# Patient Record
Sex: Male | Born: 1980
Health system: Southern US, Community
[De-identification: ages and names within clinical notes are randomized; demographics above are authoritative.]

## PROBLEM LIST (undated history)

## (undated) DIAGNOSIS — G809 Cerebral palsy, unspecified: Secondary | ICD-10-CM

## (undated) DIAGNOSIS — L723 Sebaceous cyst: Secondary | ICD-10-CM

## (undated) HISTORY — PX: COLOSTOMY: SHX63

## (undated) HISTORY — DX: Cerebral palsy, unspecified: G80.9

## (undated) HISTORY — PX: COLOSTOMY REVERSAL: SHX5782

## (undated) HISTORY — DX: Sebaceous cyst: L72.3

---

## 2004-10-30 ENCOUNTER — Ambulatory Visit: Payer: Self-pay | Admitting: Family Medicine

## 2004-11-26 ENCOUNTER — Ambulatory Visit: Payer: Self-pay | Admitting: Family Medicine

## 2005-02-12 ENCOUNTER — Ambulatory Visit: Payer: Self-pay | Admitting: Family Medicine

## 2006-01-20 ENCOUNTER — Ambulatory Visit: Payer: Self-pay | Admitting: Family Medicine

## 2006-01-20 ENCOUNTER — Ambulatory Visit (HOSPITAL_COMMUNITY): Admission: RE | Admit: 2006-01-20 | Discharge: 2006-01-20 | Payer: Self-pay | Admitting: Family Medicine

## 2006-07-22 ENCOUNTER — Ambulatory Visit: Payer: Self-pay | Admitting: Family Medicine

## 2006-08-05 ENCOUNTER — Ambulatory Visit: Payer: Self-pay | Admitting: Family Medicine

## 2006-09-09 DIAGNOSIS — H539 Unspecified visual disturbance: Secondary | ICD-10-CM | POA: Insufficient documentation

## 2006-09-09 DIAGNOSIS — G809 Cerebral palsy, unspecified: Secondary | ICD-10-CM | POA: Insufficient documentation

## 2006-09-09 DIAGNOSIS — I10 Essential (primary) hypertension: Secondary | ICD-10-CM | POA: Insufficient documentation

## 2007-05-10 ENCOUNTER — Ambulatory Visit: Payer: Self-pay | Admitting: Family Medicine

## 2007-10-04 ENCOUNTER — Ambulatory Visit: Payer: Self-pay | Admitting: Family Medicine

## 2007-10-04 ENCOUNTER — Encounter: Payer: Self-pay | Admitting: *Deleted

## 2007-10-04 ENCOUNTER — Encounter: Payer: Self-pay | Admitting: Family Medicine

## 2007-10-04 DIAGNOSIS — L723 Sebaceous cyst: Secondary | ICD-10-CM

## 2007-10-04 HISTORY — DX: Sebaceous cyst: L72.3

## 2007-10-04 LAB — CONVERTED CEMR LAB
ALT: 23 units/L (ref 0–53)
AST: 27 units/L (ref 0–37)
Albumin: 4.5 g/dL (ref 3.5–5.2)
Alkaline Phosphatase: 117 units/L (ref 39–117)
BUN: 10 mg/dL (ref 6–23)
Basophils Absolute: 0 10*3/uL (ref 0.0–0.1)
Basophils Relative: 0 % (ref 0–1)
CO2: 20 meq/L (ref 19–32)
Calcium: 9.5 mg/dL (ref 8.4–10.5)
Chloride: 102 meq/L (ref 96–112)
Creatinine, Ser: 0.91 mg/dL (ref 0.40–1.50)
Eosinophils Absolute: 0.1 10*3/uL (ref 0.0–0.7)
Eosinophils Relative: 2 % (ref 0–5)
Glucose, Bld: 72 mg/dL (ref 70–99)
HCT: 50 % (ref 39.0–52.0)
Hemoglobin: 16.5 g/dL (ref 13.0–17.0)
Lymphocytes Relative: 43 % (ref 12–46)
Lymphs Abs: 2.3 10*3/uL (ref 0.7–4.0)
MCHC: 33 g/dL (ref 30.0–36.0)
MCV: 87.6 fL (ref 78.0–100.0)
Monocytes Absolute: 0.6 10*3/uL (ref 0.1–1.0)
Monocytes Relative: 11 % (ref 3–12)
Neutro Abs: 2.3 10*3/uL (ref 1.7–7.7)
Neutrophils Relative %: 44 % (ref 43–77)
Platelets: 224 10*3/uL (ref 150–400)
Potassium: 5 meq/L (ref 3.5–5.3)
RBC: 5.71 M/uL (ref 4.22–5.81)
RDW: 14.5 % (ref 11.5–15.5)
Sodium: 141 meq/L (ref 135–145)
TSH: 1.431 microintl units/mL (ref 0.350–5.50)
Total Bilirubin: 0.4 mg/dL (ref 0.3–1.2)
Total Protein: 8.6 g/dL — ABNORMAL HIGH (ref 6.0–8.3)
WBC: 5.3 10*3/uL (ref 4.0–10.5)

## 2007-11-14 ENCOUNTER — Encounter (INDEPENDENT_AMBULATORY_CARE_PROVIDER_SITE_OTHER): Payer: Self-pay | Admitting: *Deleted

## 2007-11-25 ENCOUNTER — Ambulatory Visit: Payer: Self-pay | Admitting: Family Medicine

## 2007-12-06 ENCOUNTER — Telehealth: Payer: Self-pay | Admitting: *Deleted

## 2008-01-17 ENCOUNTER — Encounter (INDEPENDENT_AMBULATORY_CARE_PROVIDER_SITE_OTHER): Payer: Self-pay | Admitting: Family Medicine

## 2008-01-17 ENCOUNTER — Ambulatory Visit: Payer: Self-pay | Admitting: Family Medicine

## 2008-02-02 ENCOUNTER — Telehealth: Payer: Self-pay | Admitting: *Deleted

## 2008-02-17 ENCOUNTER — Encounter: Payer: Self-pay | Admitting: *Deleted

## 2008-03-08 ENCOUNTER — Ambulatory Visit: Payer: Self-pay | Admitting: Family Medicine

## 2008-03-08 ENCOUNTER — Encounter (INDEPENDENT_AMBULATORY_CARE_PROVIDER_SITE_OTHER): Payer: Self-pay | Admitting: Family Medicine

## 2008-03-08 LAB — CONVERTED CEMR LAB
BUN: 10 mg/dL (ref 6–23)
Bilirubin Urine: NEGATIVE
Blood in Urine, dipstick: NEGATIVE
CO2: 26 meq/L (ref 19–32)
Calcium: 9.7 mg/dL (ref 8.4–10.5)
Chloride: 101 meq/L (ref 96–112)
Cholesterol: 140 mg/dL (ref 0–200)
Creatinine, Ser: 0.8 mg/dL (ref 0.40–1.50)
Glucose, Bld: 71 mg/dL (ref 70–99)
Glucose, Urine, Semiquant: NEGATIVE
HDL: 44 mg/dL (ref >39)
Ketones, urine, test strip: NEGATIVE
LDL Cholesterol: 74 mg/dL (ref 0–99)
Nitrite: NEGATIVE
Potassium: 4.2 meq/L (ref 3.5–5.3)
Protein, U semiquant: NEGATIVE
Sodium: 142 meq/L (ref 135–145)
Specific Gravity, Urine: 1.015
Total CHOL/HDL Ratio: 3.2
Triglycerides: 109 mg/dL (ref <150)
Urobilinogen, UA: 0.2
VLDL: 22 mg/dL (ref 0–40)
WBC Urine, dipstick: NEGATIVE
pH: 7

## 2008-03-13 ENCOUNTER — Telehealth (INDEPENDENT_AMBULATORY_CARE_PROVIDER_SITE_OTHER): Payer: Self-pay | Admitting: Family Medicine

## 2008-04-24 ENCOUNTER — Ambulatory Visit: Payer: Self-pay | Admitting: Family Medicine

## 2008-06-06 ENCOUNTER — Encounter (INDEPENDENT_AMBULATORY_CARE_PROVIDER_SITE_OTHER): Payer: Self-pay | Admitting: *Deleted

## 2008-11-09 ENCOUNTER — Ambulatory Visit: Payer: Self-pay | Admitting: Family Medicine

## 2008-11-20 ENCOUNTER — Ambulatory Visit: Payer: Self-pay | Admitting: Family Medicine

## 2008-11-20 ENCOUNTER — Telehealth (INDEPENDENT_AMBULATORY_CARE_PROVIDER_SITE_OTHER): Payer: Self-pay | Admitting: Family Medicine

## 2008-12-17 ENCOUNTER — Telehealth: Payer: Self-pay | Admitting: *Deleted

## 2008-12-21 ENCOUNTER — Ambulatory Visit: Payer: Self-pay | Admitting: Family Medicine

## 2008-12-28 ENCOUNTER — Ambulatory Visit: Payer: Self-pay

## 2009-02-05 ENCOUNTER — Ambulatory Visit: Payer: Self-pay | Admitting: Family Medicine

## 2009-04-02 ENCOUNTER — Ambulatory Visit: Payer: Self-pay | Admitting: Family Medicine

## 2009-04-16 ENCOUNTER — Ambulatory Visit: Payer: Self-pay | Admitting: Family Medicine

## 2009-04-16 ENCOUNTER — Telehealth: Payer: Self-pay | Admitting: Sports Medicine

## 2009-05-03 ENCOUNTER — Ambulatory Visit: Payer: Self-pay | Admitting: Family Medicine

## 2010-05-16 ENCOUNTER — Telehealth (INDEPENDENT_AMBULATORY_CARE_PROVIDER_SITE_OTHER): Payer: Self-pay | Admitting: Family Medicine

## 2010-06-02 ENCOUNTER — Encounter: Payer: Self-pay | Admitting: Sports Medicine

## 2010-06-02 ENCOUNTER — Ambulatory Visit: Payer: Self-pay | Admitting: Family Medicine

## 2010-06-02 DIAGNOSIS — H1045 Other chronic allergic conjunctivitis: Secondary | ICD-10-CM

## 2010-06-02 LAB — CONVERTED CEMR LAB
BUN: 11 mg/dL (ref 6–23)
CO2: 26 meq/L (ref 19–32)
Calcium: 9.5 mg/dL (ref 8.4–10.5)
Chloride: 102 meq/L (ref 96–112)
Creatinine, Ser: 0.93 mg/dL (ref 0.40–1.50)
Glucose, Bld: 89 mg/dL (ref 70–99)
Potassium: 4.2 meq/L (ref 3.5–5.3)
Sodium: 138 meq/L (ref 135–145)

## 2010-06-23 ENCOUNTER — Ambulatory Visit: Payer: Self-pay

## 2010-07-08 ENCOUNTER — Ambulatory Visit: Payer: Self-pay | Admitting: Family Medicine

## 2010-08-14 NOTE — Assessment & Plan Note (Signed)
 Summary: bp ck & flu ,df  Nurse Visit   Vital Signs:  Patient profile:   30 year old male Temp:     98.8 degrees F Pulse rate:   80 / minute BP sitting:   132 / 82  (right arm)  Vitals Entered By: Avelina Sharps RN (May 03, 2009 2:20 PM)  Allergies: No Known Drug Allergies  Immunizations Administered:  Influenza Vaccine # 1:    Vaccine Type: Fluvax MCR    Site: left deltoid    Mfr: GlaxoSmithKline    Dose: 0.5 ml    Route: IM    Given by: Avelina Sharps RN    Exp. Date: 01/09/2010    Lot #: [id number]    VIS given: 02/19/2009  Flu Vaccine Consent Questions:    Do you have a history of severe allergic reactions to this vaccine? no    Any prior history of allergic reactions to egg and/or gelatin? no    Do you have a sensitivity to the preservative Thimersol? no    Do you have a past history of Guillan-Barre Syndrome? no    Do you currently have an acute febrile illness? no    Have you ever had a severe reaction to latex? no    Vaccine information given and explained to patient? yes  Orders Added: 1)  Influenza Vaccine MCR [00025] 2)  Administration Flu vaccine - MCR [G0008]     taking medications as directed. will forward BP reading today to MD. Avelina Sharps RN  May 03, 2009 2:22 PM  Appended Document: bp ck & flu ,df    Prescriptions: AMLODIPINE  BESYLATE 10 MG TABS (AMLODIPINE  BESYLATE) One tab by mouth daily.  #90 x 6   Entered and Authorized by:   Debby Petties MD   Signed by:   Debby Petties MD on 05/05/2009   Method used:   Electronically to        [facility] [address] (retail)       [address] [place], [place]  [address]       Ph: [phone] or [phone]       Fax: [phone]   RxID:   [id number]     Appended Document: bp ck & flu ,df mother  notified that rx has been sent in.

## 2010-08-14 NOTE — Assessment & Plan Note (Signed)
 Summary: bp check/eo  Nurse Visit   Vital Signs:  Patient profile:   30 year old male Pulse rate:   74 / minute BP sitting:   144 / 90  (right arm)  Vitals Entered By: Avelina Sharps RN (April 16, 2009 4:09 PM)  Allergies: No Known Drug Allergies  Orders Added: 1)  No Charge Patient Arrived (NCPA0) [NCPA0]   patient rested 10--15 minutes prior to BP check .mother is giving all medications as directed. will send message to Dr. Curtis  for review. Avelina Sharps RN  April 16, 2009 4:11 PM    Thanks, still elevated, will double Toprol  XL -Dr. ONEIDA.

## 2010-08-14 NOTE — Assessment & Plan Note (Signed)
 Summary: f/up,tcb   Vital Signs:  Patient profile:   30 year old male Weight:      185.7 pounds Temp:     98.7 degrees F oral Pulse rate:   69 / minute BP sitting:   152 / 100  (left arm)  Vitals Entered By: Letitia Reusing (February 05, 2009 8:47 AM)  Serial Vital Signs/Assessments:  Time      Position  BP       Pulse  Resp  Temp     By                     130/90                         Debby Petties MD  CC: f/u Is Patient Diabetic? No   Primary Care Provider:  Debby Petties MD  CC:  f/u.  History of Present Illness: 67M with MRCP comes in for follow up of metatarsalgia, HTN.  Metatarsalgia and hindfoot varus:  Dx mid June at the [facility].  Lateral heel wedge with metatarsal pad placed as he tends to oversupinate.  Was in multiple braces in the past as he used to toe-walk.  Does not appear to be in pain.  Mother concerned that he will need braces again and become clubfoot.  Was concerned that there was no wedge in show.  I removed insole and showed her the lateral wedge and she was reassured.   HTN:  Taking meds as prescribed, no changes made in dietary sodium.  BP still high today even on manual  recheck.  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  Family History: Reviewed history from 09/09/2006 and no changes required. dad- died age 16 from CAD/MI, dm, mgm- dm, htn; living age 55, Mom- htn, paternal aunt- CVA, DM, died from MI  Social History: Reviewed history from 01/17/2008 and no changes required. Lives with mom, mom's friend. Finished high school MR  Review of Systems       See HPI  Physical Exam  General:  Well-developed,well-nourished,in no acute distress; alert,appropriate  and cooperative throughout examination Lungs:  Normal respiratory effort, chest expands symmetrically. Lungs are clear to auscultation, no crackles or wheezes. Heart:  Normal rate and regular rhythm. S1 and S2 normal without gallop, murmur, click, rub or other extra sounds. Msk:  Hindfoot remains supinated/varus but mobile, heel wedge improves this.  No tenderness noted anywhere on foot.  Ambulatory without a problem.   Impression & Recommendations:  Problem # 1:  METATARSALGIA (ICD-726.70) Assessment Improved With hindfoot varus.  Continue lateral heel wedge.  No changes needed at this time.  Pt not in any pain and ambulates without difficulty.  Orders: [facility]- Est Level  3 (00786)  Problem # 2:  HYPERTENSION, BENIGN SYSTEMIC (ICD-401.1) BP still above goal of <120/80.  Mother agrees to cut out all extra sodium in his diet.  Will have him come back to see me in one month.  Will need to add another antihypertensive if no improvement.  Likely will be an alpha blocker/doxazosin.  His updated medication list for this problem includes:    Benazepril  Hcl 40 Mg Tabs (Benazepril  hcl) .SABRASABRASABRASABRA  1 tab by mouth daily    Hydrochlorothiazide  25 Mg Tabs (Hydrochlorothiazide ) .SABRA... 1 tab  by mouth daily    Amlodipine  Besylate 10 Mg Tabs (Amlodipine  besylate)  Complete Medication List: 1)  Benazepril  Hcl 40 Mg Tabs (Benazepril  hcl) .SABRA.. 1 tab by mouth daily 2)  Hydrochlorothiazide  25 Mg Tabs (Hydrochlorothiazide ) .SABRA.. 1 tab  by mouth daily 3)  Amlodipine  Besylate 10 Mg Tabs (Amlodipine  besylate) 4)  Mupirocin 2 % Oint (Mupirocin) .... Apply to area two times a day  Patient Instructions: 1)  Good to meet you today, 2)  I want you to have NO SALT added to your food for one month.  Come back to see me to reassess your blood pressure.  If it is elevated then I will have to start another medication. 3)  The orthotic for the foot appears to be doing well and I'm glad that you are not having any pain. 4)  Make  an appt to come back to see me in one month. 5)  -Dr. ONEIDA.

## 2010-08-14 NOTE — Assessment & Plan Note (Signed)
Summary: Glen Bauer   Vital Signs:  Patient profile:   30 year old male Weight:      191 pounds BMI:     37.44 Temp:     99 degrees F oral Pulse rate:   81 / minute Pulse rhythm:   regular BP sitting:   153 / 94  (left arm) Cuff size:   large  Vitals Entered By: Loralee Pacas CMA (June 02, 2010 10:49 AM) CC: cpe Is Patient Diabetic? No   Primary Care Provider:  Rodney Langton MD  CC:  cpe.  History of Present Illness: 30 yo male MR/CP.  HTN:  BP elevated, not taking toprol XL 25 two times a day as directed before.  Due for BMET.  Knee pain: Bilateral, present a long time now, pt unable to communicate pain however mother feels he is in pain at times, limps, worse when getting out of chair, she notes grinding sound behind patellae.  No swelling, no trauma.  Not giving any pain medications at home yet.  Eyes:  notes they are red, present for years, he scratches them a lot, OTC oral H1 blockers not helping.  No drainage.  No trauma.    Habits & Providers  Alcohol-Tobacco-Diet     Tobacco Status: never  Current Medications (verified): 1)  Benazepril Hcl 40 Mg  Tabs (Benazepril Hcl) .[name].. 1 Tab By Mouth Daily 2)  Hydrochlorothiazide 25 Mg Tabs (Hydrochlorothiazide) .[name].. 1 Tab  By Mouth Daily 3)  Amlodipine Besylate 10 Mg Tabs (Amlodipine Besylate) .... One Tab By Mouth Daily. 4)  Metoprolol Succinate 50 Mg Xr24h-Tab (Metoprolol Succinate) .... One Tab By Mouth Daily 5)  Mobic 7.5 Mg Tabs (Meloxicam) .... One Tab By Mouth Daily Only As Needed For Pain 6)  Patanol 0.1 % Soln (Olopatadine Hcl) .... One Drop in Each Eye Two Times A Day For Allergies.  Allergies (verified): No Known Drug Allergies  Review of Systems       See HPI  Physical Exam  General:  Well-developed,well-nourished,in no acute distress; alert,appropriate and cooperative throughout examination Eyes:  Minimal scleral erythema, not injected, no drainage, no cervical LAD. EOMI. PERRL. Lungs:  Normal  respiratory effort, chest expands symmetrically. Lungs are clear to auscultation, no crackles or wheezes. Heart:  Normal rate and regular rhythm. S1 and S2 normal without gallop, murmur, click, rub or other extra sounds. Msk:  Bilateral Knees: Normal to inspection with no erythema or effusion or obvious bony abnormalities. Palpation normal with no warmth or joint line tenderness or patellar tenderness or condyle tenderness. ROM normal in flexion and extension and lower leg rotation. Ligaments with solid consistent endpoints including ACL, PCL, LCL, MCL. Negative Mcmurray's and provocative meniscal tests. Non painful patellar compression however patellar crepitus noted, positive grind test. Patellar and quadriceps tendons unremarkable. Hamstring and quadriceps strength is normal.     Impression & Recommendations:  Problem # 1:  CONJUNCTIVITIS, ALLERGIC (ICD-372.14) Assessment New Patanol two times a day   Orders: [facility]- Est  Level 4 (04540)  Problem # 2:  NEED PROPHYLACTIC VACCINATION&INOCULATION FLU (ICD-V04.81) Assessment: Unchanged Given today.  Orders: [facility]- Est  Level 4 (98119)  Problem # 3:  HYPERTENSION, BENIGN SYSTEMIC (ICD-401.1) Assessment: Unchanged Non-compliant with toprol XL 25 two tabs once daily, changed to toprol XL 50 daily to minimize confusion. RTC 2 weeks to reassess BP. BMET today.  His updated medication list for this problem includes:    Benazepril Hcl 40 Mg Tabs (Benazepril hcl) .[name]... 1 tab by mouth daily  Hydrochlorothiazide 25 Mg Tabs (Hydrochlorothiazide) .[name]... 1 tab  by mouth daily    Amlodipine Besylate 10 Mg Tabs (Amlodipine besylate) ..... One tab by mouth daily.    Metoprolol Succinate 50 Mg Xr24h-tab (Metoprolol succinate) ..... One tab by mouth daily  Orders: [facility]- Est  Level 4 ([address]) Basic Met-[facility] ([address])  Problem # 4:  CHONDROMALACIA PATELLA, BILATERAL (ICD-717.7) Assessment: New Mobic daily as needed.  His updated medication  list for this problem includes:    Mobic 7.5 Mg Tabs (Meloxicam) ..... One tab by mouth daily only as needed for pain  Complete Medication List: 1)  Benazepril Hcl 40 Mg Tabs (Benazepril hcl) .[name].. 1 tab by mouth daily 2)  Hydrochlorothiazide 25 Mg Tabs (Hydrochlorothiazide) .[name].. 1 tab  by mouth daily 3)  Amlodipine Besylate 10 Mg Tabs (Amlodipine besylate) .... One tab by mouth daily. 4)  Metoprolol Succinate 50 Mg Xr24h-tab (Metoprolol succinate) .... One tab by mouth daily 5)  Mobic 7.5 Mg Tabs (Meloxicam) .... One tab by mouth daily only as needed for pain 6)  Patanol 0.1 % Soln (Olopatadine hcl) .... One drop in each eye two times a day for allergies.  Patient Instructions: 1)  New medicines. 2)  Change metoprolol 25 to metoprolol 50 daily. 3)  Patanol eye drops for red eyes/eye allergies. 4)  Mobic as needed for pain. 5)  Checking bloodwork. 6)  Come back to see me in 2 weeks to recheck blood pressure. 7)  -Dr. Karie Schwalbe. Prescriptions: PATANOL 0.1 % SOLN (OLOPATADINE HCL) One drop in each eye two times a day for allergies.  #1 bottle x 3   Entered and Authorized by:   Rodney Langton MD   Signed by:   Rodney Langton MD on 06/02/2010   Method used:   Electronically to        [facility] [address] (retail)       [address] [place], [place]  [address]       Ph: [phone] or [phone]       Fax: [phone]   RxID:   [id number] MOBIC 7.5 MG TABS (MELOXICAM) One tab by mouth daily only as needed for pain  #30 x 0   Entered and Authorized by:   Rodney Langton MD   Signed by:   Rodney Langton MD on 06/02/2010   Method used:   Electronically to        [facility] [address] (retail)       [address] [place], [place]  [address]       Ph: [phone] or [phone]       Fax: [phone]   RxID:   [id number] METOPROLOL SUCCINATE 50 MG XR24H-TAB (METOPROLOL SUCCINATE) One tab by mouth daily  #90 x  0   Entered and Authorized by:   Rodney Langton MD   Signed by:   Rodney Langton MD on 06/02/2010   Method used:   Electronically to        [facility] [id number] (retail)       [address] [place], [place]  [address]       Ph: [phone] or [phone]       Fax: [phone]   RxID:  [id number]    Orders Added: 1)  [facility]- Est  Level 4 [99214] 2)  Basic Met-[facility] [[address]75-[address]32]    Last Flu Vaccine:  Fluvax MCR (05/03/2009 1:55:48 PM) Flu Vaccine Result Date:  06/02/2010 Flu Vaccine Result:  given Flu Vaccine Next Due:  1 yr

## 2010-08-14 NOTE — Progress Notes (Signed)
Summary: resch  Phone Note Call from Patient   Caller: Patient Summary of Call: had to resch appt for 11/21 Initial call taken by: De Nurse,  May 16, 2010 8:38 AM

## 2010-08-14 NOTE — Assessment & Plan Note (Signed)
 Summary: f/up,tcb   Vital Signs:  Patient profile:   30 year old male Weight:      189 pounds Temp:     98.1 degrees F BP sitting:   140 / 94  (right arm) Cuff size:   regular  Vitals Entered By: Nathanel Saba RN (April 02, 2009 1:55 PM) CC: Follow up orthotics Is Patient Diabetic? No   Primary Care Provider:  Debby Petties MD  CC:  Follow up orthotics.  History of Present Illness: here for fu of BP and metatarsalgia.  BP:  Maxed out meds at last visit.  BP still elevated today.  Metatarsalgia:  With hindfoot varus, lateral wedge with metatarsal pads working very well.  Glen Bauer spends most of his time at home wearing slippers, so his orthotics with pads do not benefit him as much.  Habits & Providers  Alcohol-Tobacco-Diet     Tobacco Status: never  Allergies: No Known Drug Allergies  Review of Systems       See HPI  Physical Exam  General:  Well-developed,well-nourished,in no acute distress; alert,appropriate and cooperative throughout examination Lungs:  Normal respiratory effort, chest expands symmetrically. Lungs are clear to auscultation, no crackles or wheezes. Heart:  Normal rate and regular rhythm. S1 and S2 normal without gallop, murmur, click, rub or other extra sounds. Abdomen:  Bowel sounds positive,abdomen soft and non-tender without masses, organomegaly or hernias noted.   Impression & Recommendations:  Problem # 1:  HYPERTENSION, BENIGN SYSTEMIC (ICD-401.1) Assessment Improved Better but not at goal.  Added Toprol  XL.  Low dose, pt to RTC 2 weeks for RN check.  Will further adjust as needed.  His updated medication list for this problem includes:    Benazepril  Hcl 40 Mg Tabs (Benazepril  hcl) .SABRA... 1 tab by mouth daily    Hydrochlorothiazide  25 Mg Tabs (Hydrochlorothiazide ) .SABRA... 1 tab  by mouth daily    Amlodipine  Besylate 10 Mg Tabs (Amlodipine  besylate)    Metoprolol  Succinate 25 Mg Xr24h-tab (Metoprolol  succinate) ..... One  tab by mouth daily  Orders: [facility]- Est Level  3 (00786)  Problem # 2:  METATARSALGIA (ICD-726.70) Assessment: Improved Current orthotics working well.  Mother wondering if he could have some for his bedroom slippers.  He spends most of his time at home so the orthotics in his sneakers don't help as much.  I recommended that he wear his sneakers around the house so the he could benefit from the improved alignment.  We didn't have any orthotic material here in the [facility] to build another set.  i don't think that he would get much benefit from orthotics placed in slippers anyway.  He has excellent alignment with orthotics in place in his sneakers.  Orders: [facility]- Est Level  3 (00786)  Complete Medication List: 1)  Benazepril  Hcl 40 Mg Tabs (Benazepril  hcl) .SABRA.. 1 tab by mouth daily 2)  Hydrochlorothiazide  25 Mg Tabs (Hydrochlorothiazide ) .SABRA.. 1 tab  by mouth daily 3)  Amlodipine  Besylate 10 Mg Tabs (Amlodipine  besylate) 4)  Mupirocin 2 % Oint (Mupirocin) .... Apply to area two times a day 5)  Metoprolol  Succinate 25 Mg Xr24h-tab (Metoprolol  succinate) .... One tab by mouth daily  Patient Instructions: 1)  Great to see you two today, 2)  I wil be sending another medication,metoprolol  XR to [facility].  This will help lower Glen Bauer's BP to the normal range.  Try it for 2 weeks then come back to have an RN check his BP.  3)  We don't have  any orthotic supplies here.  He can wear the sneaker around the house.   4)  -Dr. ONEIDA. Prescriptions: METOPROLOL  SUCCINATE 25 MG XR24H-TAB (METOPROLOL  SUCCINATE) One tab by mouth daily  #30 x 0   Entered and Authorized by:   Debby Petties MD   Signed by:   Debby Petties MD on 04/02/2009   Method used:   Electronically to        [facility] [address] (retail)       [address] [place], [place]  [address]       Ph: [phone] or [phone]       Fax: [phone]   RxID:   [id number]    Vital Signs:  Patient profile:    30 year old male Weight:      189 pounds Temp:     98.1 degrees F BP sitting:   140 / 94  (right arm) Cuff size:   regular  Vitals Entered By: Nathanel Saba RN (April 02, 2009 1:55 PM)  Appended Document: f/up,tcb     Allergies: No Known Drug Allergies   Complete Medication List: 1)  Benazepril  Hcl 40 Mg Tabs (Benazepril  hcl) .SABRA.. 1 tab by mouth daily 2)  Hydrochlorothiazide  25 Mg Tabs (Hydrochlorothiazide ) .SABRA.. 1 tab  by mouth daily 3)  Amlodipine  Besylate 10 Mg Tabs (Amlodipine  besylate) 4)  Mupirocin 2 % Oint (Mupirocin) .... Apply to area two times a day 5)  Metoprolol  Succinate 25 Mg Xr24h-tab (Metoprolol  succinate) .... One tab by mouth daily    Prevention & Chronic Care Immunizations   Influenza vaccine: Fluvax 3+  (05/10/2007)   Influenza vaccine due: 05/09/2008    Tetanus booster: Not documented    Pneumococcal vaccine: Not documented  Other Screening   Smoking status: never  (04/02/2009)  Hypertension   Last Blood Pressure: 140 / 94  (04/02/2009)   Serum creatinine: 0.80  (03/08/2008)   Serum potassium 4.2  (03/08/2008)    Hypertension flowsheet reviewed?: Yes   Progress toward BP goal: Improved  Self-Management Support :   Personal Goals (by the next clinic visit) :      Personal blood pressure goal: 130/80  (04/02/2009)   Hypertension self-management support: Not documented

## 2010-08-14 NOTE — Assessment & Plan Note (Signed)
Summary: BP CHECK/PER DR. T/[facility]   Vital Signs:  Patient profile:   30 year old male Pulse rate:   60 / minute BP sitting:   134 / 87  (left arm) Cuff size:   large  Vitals Entered By: Loralee Pacas CMA (July 08, 2010 4:19 PM) CC: bp check   CC:  bp check.  Allergies: No Known Drug Allergies   Complete Medication List: 1)  Benazepril Hcl 40 Mg Tabs (Benazepril hcl) .[name].. 1 tab by mouth daily 2)  Hydrochlorothiazide 25 Mg Tabs (Hydrochlorothiazide) .[name].. 1 tab  by mouth daily 3)  Amlodipine Besylate 10 Mg Tabs (Amlodipine besylate) .... One tab by mouth daily. 4)  Metoprolol Succinate 50 Mg Xr24h-tab (Metoprolol succinate) .... One tab by mouth daily 5)  Mobic 7.5 Mg Tabs (Meloxicam) .... One tab by mouth daily only as needed for pain 6)  Patanol 0.1 % Soln (Olopatadine hcl) .... One drop in each eye two times a day for allergies.  Other Orders: No Charge Patient Arrived (NCPA0) (NCPA0)   Orders Added: 1)  No Charge Patient Arrived (NCPA0) [NCPA0]

## 2010-08-14 NOTE — Progress Notes (Signed)
 Summary: phn msg  Phone Note Call from Patient Call back at Home Phone [phone]   Caller: mom-Glen Bauer Summary of Call: mom calling back to tell the name of his medication  Amlodipine  5mg  and he takes 2 a day.   Initial call taken by: Madelin Daring,  April 16, 2009 2:45 PM  Follow-up for Phone Call        Noted, flag sent to [facility] team that Toprol  XL doubled and to return 2 weeks for BP check. Follow-up by: Debby Petties MD,  April 17, 2009 2:33 PM     Appended Document: phn msg Pt with BP still elevated, I have doubled his Toprol  Xl to two 25mg  tabs a day, have them take this until they run out, then call back and I will refill with 50mg  tabs to be taken once daily.  RTC 2 weeks for another RN BP check. Thanks, -Dr. ONEIDA.  No answering machine. will try again later  Appended Document: phn msg pt's mom notrified

## 2010-09-05 ENCOUNTER — Other Ambulatory Visit: Payer: Self-pay | Admitting: Sports Medicine

## 2010-09-07 NOTE — Telephone Encounter (Signed)
Refill request

## 2010-09-23 ENCOUNTER — Encounter: Payer: Self-pay | Admitting: Sports Medicine

## 2010-09-23 ENCOUNTER — Ambulatory Visit (INDEPENDENT_AMBULATORY_CARE_PROVIDER_SITE_OTHER): Payer: Medicare Other | Admitting: Sports Medicine

## 2010-09-23 DIAGNOSIS — H1045 Other chronic allergic conjunctivitis: Secondary | ICD-10-CM

## 2010-09-23 DIAGNOSIS — E049 Nontoxic goiter, unspecified: Secondary | ICD-10-CM

## 2010-09-23 DIAGNOSIS — I10 Essential (primary) hypertension: Secondary | ICD-10-CM

## 2010-09-23 DIAGNOSIS — J4520 Mild intermittent asthma, uncomplicated: Secondary | ICD-10-CM | POA: Insufficient documentation

## 2010-09-23 DIAGNOSIS — R220 Localized swelling, mass and lump, head: Secondary | ICD-10-CM

## 2010-09-23 DIAGNOSIS — J45909 Unspecified asthma, uncomplicated: Secondary | ICD-10-CM

## 2010-09-23 DIAGNOSIS — E010 Iodine-deficiency related diffuse (endemic) goiter: Secondary | ICD-10-CM

## 2010-09-23 DIAGNOSIS — R221 Localized swelling, mass and lump, neck: Secondary | ICD-10-CM | POA: Insufficient documentation

## 2010-09-23 LAB — TSH: TSH: 1.297 u[IU]/mL (ref 0.350–4.500)

## 2010-09-23 LAB — BASIC METABOLIC PANEL
BUN: 12 mg/dL (ref 6–23)
Calcium: 9.8 mg/dL (ref 8.4–10.5)
Potassium: 3.8 mEq/L (ref 3.5–5.3)
Sodium: 140 mEq/L (ref 135–145)

## 2010-09-23 LAB — CONVERTED CEMR LAB
CO2: 27 meq/L (ref 19–32)
Calcium: 9.8 mg/dL (ref 8.4–10.5)
Chloride: 101 meq/L (ref 96–112)
Sodium: 140 meq/L (ref 135–145)

## 2010-09-23 MED ORDER — DIPHENHYDRAMINE HCL 25 MG PO CAPS: 25.0000 mg | ORAL_CAPSULE | Freq: Four times a day (QID) | ORAL | Status: DC | PRN

## 2010-09-23 MED ORDER — ALBUTEROL SULFATE (2.5 MG/3ML) 0.083% IN NEBU: INHALATION_SOLUTION | RESPIRATORY_TRACT | Status: AC

## 2010-09-23 MED ORDER — OLOPATADINE HCL 0.2 % OP SOLN: OPHTHALMIC | Status: DC

## 2010-09-23 NOTE — Assessment & Plan Note (Signed)
Mild intermittent with attacks only 1-2x a year. Will rx albuterol nebs.

## 2010-09-23 NOTE — Assessment & Plan Note (Signed)
?  thyromegaly Mother with hx hypothyroid. Checking TSH

## 2010-09-23 NOTE — Assessment & Plan Note (Signed)
Chng to pataday. Benadryl.

## 2010-09-23 NOTE — Patient Instructions (Signed)
See below for new meds. Checking thyroid levels.  Come back to see me in 2 weeks if allergic symptoms no better.  Will let you know if thyroid tests abnormal.  -Dr. Karie Schwalbe.

## 2010-09-23 NOTE — Progress Notes (Signed)
  Subjective:    Patient ID: Glen Bauer, male    DOB: Dec 13, 1980, 30 y.o.   MRN: [id number]  HPI HTN:  Taking meds as tol.  Allergies:  Stuffy nose, itchy watery eyes, coughing, taking moms zyrtec with only minimal improvement.     Review of Systems    See HPI Objective:   Physical Exam  Constitutional: He appears well-developed and well-nourished. No distress.  HENT:  Head: Normocephalic.  Right Ear: External ear normal.  Left Ear: External ear normal.  Nose: Nose normal.  Mouth/Throat: Oropharynx is clear and moist. No oropharyngeal exudate.  Eyes: Pupils are equal, round, and reactive to light.       Minimal conjunctival erythema/irritation.  Neck: Normal range of motion. Neck supple. No JVD present. Thyromegaly present.  Cardiovascular: Normal rate, regular rhythm, normal heart sounds and intact distal pulses.  Exam reveals no gallop and no friction rub.   No murmur heard. Pulmonary/Chest: Effort normal. No stridor. No respiratory distress. He has wheezes. He has no rales. He exhibits no tenderness.       Exp wheeze  Lymphadenopathy:    He has no cervical adenopathy.  Skin: Skin is warm and dry.          Assessment & Plan:

## 2010-09-23 NOTE — Assessment & Plan Note (Signed)
Controlled, no changes needed 

## 2011-01-26 ENCOUNTER — Other Ambulatory Visit: Payer: Self-pay | Admitting: Sports Medicine

## 2011-01-26 NOTE — Telephone Encounter (Signed)
Needs office visit for more refills.  

## 2011-01-26 NOTE — Telephone Encounter (Signed)
Refill request

## 2011-03-04 ENCOUNTER — Other Ambulatory Visit: Payer: Self-pay | Admitting: Sports Medicine

## 2011-03-04 NOTE — Telephone Encounter (Signed)
Refill request for Dr. Ritch's patient 

## 2011-03-09 ENCOUNTER — Ambulatory Visit (INDEPENDENT_AMBULATORY_CARE_PROVIDER_SITE_OTHER): Payer: Medicare Other | Admitting: Family Medicine

## 2011-03-09 ENCOUNTER — Encounter: Payer: Self-pay | Admitting: Family Medicine

## 2011-03-09 VITALS — BP 146/95 | HR 69 | Temp 99.2°F | Wt 201.2 lb

## 2011-03-09 DIAGNOSIS — M25569 Pain in unspecified knee: Secondary | ICD-10-CM

## 2011-03-09 DIAGNOSIS — M25562 Pain in left knee: Secondary | ICD-10-CM

## 2011-03-09 DIAGNOSIS — I10 Essential (primary) hypertension: Secondary | ICD-10-CM

## 2011-03-09 DIAGNOSIS — Z23 Encounter for immunization: Secondary | ICD-10-CM

## 2011-03-09 DIAGNOSIS — E669 Obesity, unspecified: Secondary | ICD-10-CM

## 2011-03-09 MED ORDER — METOPROLOL SUCCINATE ER 50 MG PO TB24: 50.0000 mg | ORAL_TABLET | Freq: Every day | ORAL | Status: DC

## 2011-03-09 MED ORDER — MELOXICAM 7.5 MG PO TABS: 7.5000 mg | ORAL_TABLET | Freq: Every day | ORAL | Status: DC

## 2011-03-09 MED ORDER — AMLODIPINE BESYLATE 10 MG PO TABS: 10.0000 mg | ORAL_TABLET | Freq: Every day | ORAL | Status: DC

## 2011-03-09 NOTE — Progress Notes (Signed)
Subjective: Pt presents today for followup.  Mom has one concern.  She is worried that his left knee pops when he stand up sometimes.  He also seems to have pain in it from time to time.  He as prescribed Mobic at his last visit and has gotten some relief from this.  His mother continues to be concerned, however, that there eis something significantly wrong with him.  Otherwise, she says he is happy and cooperative, although he has other caregivers who are not on board with weight control in him.  Pt has lost a small amount of weight per his mother, and she wants to help him get in an exercise program, although she has no concrete plans on how to inact this.  Otherwise, patient's medical history is unchanged.  Pt's PMHx and social hisory reviewed and are unremarkable.  ROS is per HPI.  Pt does not communicate orally with me.  Objective:  Filed Vitals:   03/09/11 1331  BP: 146/95  Pulse: 69  Temp: 99.2 F (37.3 C)   GEN: NAD, happy and cooperative CV: RRR RESP: CTABL ABD: Obese, nontender, nondistended EXT: Knees without crepitus, full ROM. No pain with movement.

## 2011-03-09 NOTE — Patient Instructions (Signed)
It was good to see you today! I want to see you back in about 6 weeks to recheck your blood pressure.  Between now and then I want you to focus on smaller portion sizes (eating less) and getting some exercise.  If you can manage to get your treadmill fixed that would be wonderful.  Otherwise, try talking with the folks at the [facility] to see about exercise classes. We will get an xray of your left knee today.  I will let you know if there are any worrisome findings on it.

## 2011-03-18 DIAGNOSIS — M25562 Pain in left knee: Secondary | ICD-10-CM | POA: Insufficient documentation

## 2011-03-18 DIAGNOSIS — E669 Obesity, unspecified: Secondary | ICD-10-CM | POA: Insufficient documentation

## 2011-03-18 NOTE — Assessment & Plan Note (Signed)
Will continue to provide refills on Mobic as needed, and will obtain a plain film of the knee today to rule out any significant pathology.

## 2011-03-18 NOTE — Assessment & Plan Note (Signed)
Will encourage participation in and exercise program, and will continue to recommend conversations with all of the patient's caregivers about portion size control.

## 2011-03-18 NOTE — Assessment & Plan Note (Signed)
Will provide refills on the patient's medications and will see him back in several weeks were up blood pressure check. His blood pressure is mildly elevated today, however I am not inclined to do anything about this at this point is he is previously been well-controlled.

## 2011-07-10 ENCOUNTER — Telehealth: Payer: Self-pay | Admitting: *Deleted

## 2011-07-10 DIAGNOSIS — I10 Essential (primary) hypertension: Secondary | ICD-10-CM

## 2011-07-10 MED ORDER — HYDROCHLOROTHIAZIDE 25 MG PO TABS: 25.0000 mg | ORAL_TABLET | Freq: Every day | ORAL | Status: DC

## 2011-07-10 NOTE — Telephone Encounter (Signed)
Notified family that patient needs appointment . Consulted with Dr. Jennette Kettle and she advises may refill HCTZ.

## 2011-07-17 ENCOUNTER — Ambulatory Visit: Payer: Medicare Other | Admitting: Family Medicine

## 2011-07-29 ENCOUNTER — Other Ambulatory Visit: Payer: Self-pay | Admitting: Sports Medicine

## 2011-07-29 ENCOUNTER — Ambulatory Visit: Payer: Medicare Other | Admitting: Family Medicine

## 2011-07-29 NOTE — Telephone Encounter (Signed)
Refill request

## 2011-08-25 ENCOUNTER — Ambulatory Visit (INDEPENDENT_AMBULATORY_CARE_PROVIDER_SITE_OTHER): Payer: Medicare Other | Admitting: Family Medicine

## 2011-08-25 ENCOUNTER — Encounter: Payer: Self-pay | Admitting: Family Medicine

## 2011-08-25 VITALS — BP 144/90 | HR 74 | Temp 98.9°F | Ht 60.0 in | Wt 200.8 lb

## 2011-08-25 DIAGNOSIS — M25569 Pain in unspecified knee: Secondary | ICD-10-CM

## 2011-08-25 DIAGNOSIS — I10 Essential (primary) hypertension: Secondary | ICD-10-CM

## 2011-08-25 DIAGNOSIS — E669 Obesity, unspecified: Secondary | ICD-10-CM

## 2011-08-25 DIAGNOSIS — M25562 Pain in left knee: Secondary | ICD-10-CM

## 2011-08-25 MED ORDER — HYDROCHLOROTHIAZIDE 25 MG PO TABS: 25.0000 mg | ORAL_TABLET | Freq: Every day | ORAL | Status: DC

## 2011-08-25 MED ORDER — METOPROLOL SUCCINATE ER 50 MG PO TB24: 50.0000 mg | ORAL_TABLET | Freq: Every day | ORAL | Status: DC

## 2011-08-25 MED ORDER — BENAZEPRIL HCL 40 MG PO TABS: 40.0000 mg | ORAL_TABLET | Freq: Every day | ORAL | Status: DC

## 2011-08-25 MED ORDER — AMLODIPINE BESYLATE 10 MG PO TABS: 10.0000 mg | ORAL_TABLET | Freq: Every day | ORAL | Status: DC

## 2011-08-25 MED ORDER — MELOXICAM 7.5 MG PO TABS: 7.5000 mg | ORAL_TABLET | Freq: Every day | ORAL | Status: DC

## 2011-08-25 NOTE — Assessment & Plan Note (Signed)
Patient's blood pressure is above goal today. He has not taken his hydrochlorothiazide and some time however. I will restart this medication, sent in refills on all his remaining blood pressure medications, and have him come back to the office in 3-4 weeks

## 2011-08-25 NOTE — Assessment & Plan Note (Signed)
The patient doesn't have slightly limited mobility in his left knee today. There is no evidence of effusion. We will obtain an x-ray today. Otherwise treatment symptomatic with Mobic and tylenol

## 2011-08-25 NOTE — Progress Notes (Signed)
Subjective: The patient is a 31 y.o. year old male who presents today for follow up.  Still having problems with left knee pain and popping.  Never went to get x-ray that was ordered 03/2011.  Seems to be helped some by mother exercising .  Pt has not been taking HCTZ here recently due to running out.  Weight steady.  Mother trying to encourage more healthy eating but not very successful.  Objective:  Filed Vitals:   08/25/11 1358  BP: 144/90  Pulse: 74  Temp: 98.9 F (37.2 C)   Gen: Obese, nonvocal, NAD CV: RRR, no murmurs Resp: CTABL, decreased secondary to body habitus Ext: No edema, left knee slightly limited mobility, right knee has full ROM.  Slight crepitus in left knee.  Assessment/Plan: Left knee pain, possibly early OA HTN, worsened due to running out of meds.  Please also see individual problems in problem list for problem-specific plans.

## 2011-08-25 NOTE — Patient Instructions (Signed)
It was great to see you today! I have sent in refills on all her blood pressure medications. I want you to start taking them again. Please come back to see me in one month so we can check your blood pressure again.

## 2011-08-25 NOTE — Assessment & Plan Note (Signed)
Encouraged healthier eating and weight loss. Set weight goal of at least 2 pounds weight loss by his visit next month.

## 2011-09-17 ENCOUNTER — Ambulatory Visit
Admission: RE | Admit: 2011-09-17 | Discharge: 2011-09-17 | Disposition: A | Discharge status: Home / Self Care | Payer: Medicare Other | Source: Ambulatory Visit | Attending: Family Medicine | Admitting: Family Medicine

## 2011-09-17 DIAGNOSIS — M25562 Pain in left knee: Secondary | ICD-10-CM

## 2011-10-05 ENCOUNTER — Ambulatory Visit (INDEPENDENT_AMBULATORY_CARE_PROVIDER_SITE_OTHER): Payer: Medicare Other | Admitting: Family Medicine

## 2011-10-05 ENCOUNTER — Encounter: Payer: Self-pay | Admitting: Family Medicine

## 2011-10-05 VITALS — BP 160/100 | HR 70 | Temp 98.3°F | Ht 60.0 in | Wt 203.8 lb

## 2011-10-05 DIAGNOSIS — E669 Obesity, unspecified: Secondary | ICD-10-CM

## 2011-10-05 DIAGNOSIS — M25569 Pain in unspecified knee: Secondary | ICD-10-CM

## 2011-10-05 DIAGNOSIS — M25562 Pain in left knee: Secondary | ICD-10-CM

## 2011-10-05 DIAGNOSIS — I10 Essential (primary) hypertension: Secondary | ICD-10-CM

## 2011-10-05 LAB — BASIC METABOLIC PANEL
CO2: 27 mEq/L (ref 19–32)
Calcium: 9.7 mg/dL (ref 8.4–10.5)
Sodium: 139 mEq/L (ref 135–145)

## 2011-10-05 MED ORDER — METOPROLOL SUCCINATE ER 100 MG PO TB24: 100.0000 mg | ORAL_TABLET | Freq: Every day | ORAL | Status: DC

## 2011-10-05 NOTE — Progress Notes (Signed)
Subjective: The patient is a 31 y.o. year old male who presents today for follow up.  Knee pain: the patient's mother reports that last week the patient had one episode of crying while his mother was placing lotion on his left leg.  She feels this was related to his left knee and movement of it.  He has otherwise not been having significant problems and his mother is giving him 500 mg of Tylenol every other day.  Review of the patient's x-ray shows mild, early osteoarthritis and no other acute findings.  Concern for diabetes: the patient's mother is also concerned that he may have diabetes.  This concern is not based on any symptoms, but rather due to a family history in the patient's weight.  He has never had any labs that would suggest this diagnosis to the mother's remembrance.  Hypertension: the patient takes his medications daily.  His blood pressure today significantly elevated.  As he is not able to communicate verbally, I cannot determine if he has significant signs from this hypertension, however his mother assures me that he is acting normally.  Social: the patient is a non-smoker, non-drinker who lives with his mother who is his primary caregiver.  Objective:  Filed Vitals:   10/05/11 1514  BP: 160/100  Pulse: 70  Temp: 98.3 F (36.8 C)   Gen: happy, obese male in no obvious distress.  Cooperative with exam but does not communicate verbally. CV: regular rate and rhythm, no murmurs appreciated Resp: clear to auscultation bilaterally Ext: left knee continues to have minimal to no joint effusion.  No warmth.  No erythema.  The patient slightly restricts his range of motion in the left knee, however he is able to walk without difficulty.  Assessment/Plan:  Please also see individual problems in problem list for problem-specific plans.

## 2011-10-05 NOTE — Patient Instructions (Signed)
Start taking 500mg  of Tylenol two times per day. Start taking your new dose of your metoprolol tomorrow.  We are increasing from 50mg  to 100mg . I want to see you back in 2 weeks.

## 2011-10-08 NOTE — Assessment & Plan Note (Signed)
We will try treating the patient's knee pain with Tylenol twice daily.  I'm not concerned for significant pathology of his knee, although some inactivity may cause some joint stiffness.  In the future, he may benefit from one or two visits with the physical therapist to work on range of motion exercises.  I am not sure how his insurance would cover this, so we will try simple medications first.

## 2011-10-08 NOTE — Assessment & Plan Note (Signed)
I have continue to have discussions with the patient's mother about the importance of getting all caregivers on the same page about how much, and what the patient eats.  Due to his obesity, I will screen them for both diabetes and hyperlipidemia and will plan to do so on a least a biyearly basis in the future.

## 2011-10-08 NOTE — Assessment & Plan Note (Signed)
I will increase the patient's metoprolol and see him back in two weeks.  I feel that he likely can handle this as his resting pulse is currently in the 70s.  I advised his mother on signs of beta blocker over dose.

## 2011-10-22 ENCOUNTER — Ambulatory Visit: Payer: Medicare Other | Admitting: Family Medicine

## 2011-11-11 ENCOUNTER — Encounter: Payer: Self-pay | Admitting: Family Medicine

## 2011-11-11 ENCOUNTER — Ambulatory Visit (INDEPENDENT_AMBULATORY_CARE_PROVIDER_SITE_OTHER): Payer: Medicare Other | Admitting: Family Medicine

## 2011-11-11 VITALS — BP 158/100 | HR 71 | Temp 98.8°F | Wt 202.9 lb

## 2011-11-11 DIAGNOSIS — M25562 Pain in left knee: Secondary | ICD-10-CM

## 2011-11-11 DIAGNOSIS — I10 Essential (primary) hypertension: Secondary | ICD-10-CM

## 2011-11-11 DIAGNOSIS — M25569 Pain in unspecified knee: Secondary | ICD-10-CM

## 2011-11-11 DIAGNOSIS — H1045 Other chronic allergic conjunctivitis: Secondary | ICD-10-CM

## 2011-11-11 MED ORDER — TRAMADOL HCL 50 MG PO TABS: 50.0000 mg | ORAL_TABLET | Freq: Three times a day (TID) | ORAL | Status: DC | PRN

## 2011-11-11 MED ORDER — DIPHENHYDRAMINE HCL 25 MG PO CAPS: 25.0000 mg | ORAL_CAPSULE | Freq: Four times a day (QID) | ORAL | Status: DC | PRN

## 2011-11-11 NOTE — Assessment & Plan Note (Signed)
I will discontinue the patient's Mobic in case this is contributing to his hypertension. Patient is return to clinic in about 2 weeks. At that time we'll recheck his blood pressure. If it has not come down some with this intervention, and will likely begin treatment with clonidine.

## 2011-11-11 NOTE — Progress Notes (Signed)
Patient ID: Glen Bauer, male   DOB: 22-Oct-1980, 31 y.o.   MRN: [id number] Subjective: The patient is a 31 y.o. year old male who presents today for bp f/u.  1. BP: Continues to take his medication as prescribed.  No headaches, recent changes in vision, or obvious signs of SOB/CP.  2. Ears: Mother was cleaning ears after bath last night and thought she saw something in the right ear.  No pain, changes in hearing, discharge.  3. Health maintenance: Has had eyes checked in last month with no concerning findings.  Patient's past medical, social, and family history were reviewed and updated as appropriate. Smoking status: non-smoker  Objective:  Filed Vitals:   11/11/11 1411  BP: 158/100  Pulse: 71  Temp: 98.8 F (37.1 C)   Gen: Obese, otherwise happy, NAD HEENT: TM clear b/l, no discharge.  Small amount of wax in right ear canal, non-occlusive CV: RRR, no murmurs Resp: CTABL Ext: No edema, 2+ pulses  Assessment/Plan:  Please also see individual problems in problem list for problem-specific plans.

## 2011-11-11 NOTE — Patient Instructions (Signed)
It was good to see you today! I have sent in a prescription for the pain medicine Tramadol.  This is a strong pain medicine that he can take if his knees are really bothering him.  i don't want him taking it every day.  You can give him Tylenol every day. I want him to stop taking the medicine meloxicam as it can sometimes increase people's blood pressure. Come back to see me in 2-3 weeks to check in on his blood pressure.

## 2011-11-11 NOTE — Assessment & Plan Note (Signed)
With the cessation of Mobic for his knee pain I will encouraged Tylenol use and will give a when necessary prescription of tramadol for severe pain. I discussed this with his mother and emphasized the importance of using this only for severe pain.

## 2011-12-16 ENCOUNTER — Ambulatory Visit (INDEPENDENT_AMBULATORY_CARE_PROVIDER_SITE_OTHER): Payer: Medicare Other | Admitting: Family Medicine

## 2011-12-16 ENCOUNTER — Encounter: Payer: Self-pay | Admitting: Family Medicine

## 2011-12-16 VITALS — BP 132/88 | HR 62 | Temp 98.0°F | Ht 60.0 in | Wt 201.0 lb

## 2011-12-16 DIAGNOSIS — Z659 Problem related to unspecified psychosocial circumstances: Secondary | ICD-10-CM

## 2011-12-16 DIAGNOSIS — Z658 Other specified problems related to psychosocial circumstances: Secondary | ICD-10-CM

## 2011-12-16 DIAGNOSIS — E669 Obesity, unspecified: Secondary | ICD-10-CM

## 2011-12-16 DIAGNOSIS — I10 Essential (primary) hypertension: Secondary | ICD-10-CM

## 2011-12-16 NOTE — Assessment & Plan Note (Signed)
Now well controlled, no changes to meds at this time.

## 2011-12-16 NOTE — Progress Notes (Signed)
Patient ID: Glen Bauer, male   DOB: May 15, 1981, 31 y.o.   MRN: [id number] Subjective: The patient is a 31 y.o. year old male who presents today for follow up.  1. HTN: Pt taking meds daily without problems.  No complaints of headaches, nausea, vomiting, diarrhea, chest pain, or problems breathing.  2. Obesity: Mother trying to cut back on how much he is eating but has a hard time with other family members.  He also likes ice cream too much.  Have not seen a nutritionist since he was a teen and would be open to the idea.  3. Social: Mother reports that she is having a very hard time with finances.  She is having a hard time with getting medicare to cover various DME (diapers, bed pads, etc) and has a very limited income on which to take care of both of them.  Requests speaking with social work for any help then I provided.  Patient's past medical, social, and family history were reviewed and updated as appropriate. History  Substance Use Topics  . Smoking status: Never Smoker   . Smokeless tobacco: Not on file  . Alcohol Use: Not on file   Objective:  Filed Vitals:   12/16/11 1019  BP: 132/88  Pulse: 62  Temp: 98 F (36.7 C)   Gen: NAD, obese, non-verbal but happy CV: RRR Resp: CTABL Ext: No edema  Assessment/Plan: Will request social work call patient to see if any resources are available to help with finances/equipment.  Please also see individual problems in problem list for problem-specific plans.   Greater than 25 min spent with patient discussing social situation and coordinating care

## 2011-12-16 NOTE — Assessment & Plan Note (Signed)
Continue to encourage decreases in high-calorie foods and portion control.  Will refer to nutrition for help with dietary changes to make hypertension better and to help with weight loss.

## 2011-12-16 NOTE — Patient Instructions (Signed)
It was great to see you today! I want you to see our nutritionist to talk about what we can do with your diet to help with your blood pressure.

## 2011-12-28 ENCOUNTER — Other Ambulatory Visit: Payer: Self-pay | Admitting: *Deleted

## 2011-12-28 MED ORDER — METOPROLOL SUCCINATE ER 100 MG PO TB24: 100.0000 mg | ORAL_TABLET | Freq: Every day | ORAL | Status: DC

## 2011-12-28 MED ORDER — AMLODIPINE BESYLATE 10 MG PO TABS: 10.0000 mg | ORAL_TABLET | Freq: Every day | ORAL | Status: DC

## 2012-02-04 ENCOUNTER — Other Ambulatory Visit: Payer: Self-pay | Admitting: *Deleted

## 2012-02-04 DIAGNOSIS — I10 Essential (primary) hypertension: Secondary | ICD-10-CM

## 2012-02-04 MED ORDER — HYDROCHLOROTHIAZIDE 25 MG PO TABS: 25.0000 mg | ORAL_TABLET | Freq: Every day | ORAL | Status: DC

## 2012-02-04 MED ORDER — BENAZEPRIL HCL 40 MG PO TABS: 40.0000 mg | ORAL_TABLET | Freq: Every day | ORAL | Status: DC

## 2012-04-11 ENCOUNTER — Other Ambulatory Visit: Payer: Self-pay | Admitting: *Deleted

## 2012-04-11 MED ORDER — METOPROLOL SUCCINATE ER 100 MG PO TB24: 100.0000 mg | ORAL_TABLET | Freq: Every day | ORAL | Status: DC

## 2012-04-11 MED ORDER — AMLODIPINE BESYLATE 10 MG PO TABS: 10.0000 mg | ORAL_TABLET | Freq: Every day | ORAL | Status: DC

## 2012-04-13 ENCOUNTER — Other Ambulatory Visit: Payer: Self-pay | Admitting: Family Medicine

## 2012-04-29 ENCOUNTER — Other Ambulatory Visit: Payer: Self-pay | Admitting: Family Medicine

## 2012-04-29 MED ORDER — TRAMADOL HCL 50 MG PO TABS: 50.0000 mg | ORAL_TABLET | Freq: Three times a day (TID) | ORAL | Status: AC | PRN

## 2012-06-10 ENCOUNTER — Other Ambulatory Visit: Payer: Self-pay | Admitting: Family Medicine

## 2012-07-10 ENCOUNTER — Other Ambulatory Visit: Payer: Self-pay | Admitting: Family Medicine

## 2012-08-12 ENCOUNTER — Other Ambulatory Visit: Payer: Self-pay | Admitting: Family Medicine

## 2012-08-23 ENCOUNTER — Other Ambulatory Visit: Payer: Self-pay | Admitting: Family Medicine

## 2012-09-09 ENCOUNTER — Other Ambulatory Visit: Payer: Self-pay | Admitting: Family Medicine

## 2012-09-28 ENCOUNTER — Other Ambulatory Visit: Payer: Self-pay | Admitting: Family Medicine

## 2012-10-10 ENCOUNTER — Other Ambulatory Visit: Payer: Self-pay | Admitting: Family Medicine

## 2012-10-17 ENCOUNTER — Other Ambulatory Visit: Payer: Self-pay | Admitting: Family Medicine

## 2013-01-02 ENCOUNTER — Other Ambulatory Visit: Payer: Self-pay | Admitting: Family Medicine

## 2013-01-31 ENCOUNTER — Other Ambulatory Visit: Payer: Self-pay | Admitting: Family Medicine

## 2013-03-08 ENCOUNTER — Encounter: Payer: Medicare Other | Admitting: Sports Medicine

## 2013-03-27 ENCOUNTER — Other Ambulatory Visit: Payer: Self-pay | Admitting: Family Medicine

## 2013-03-30 ENCOUNTER — Encounter: Payer: Self-pay | Admitting: Sports Medicine

## 2013-03-30 ENCOUNTER — Ambulatory Visit (INDEPENDENT_AMBULATORY_CARE_PROVIDER_SITE_OTHER): Payer: Medicare Other | Admitting: Sports Medicine

## 2013-03-30 VITALS — BP 138/76 | HR 69 | Temp 99.8°F | Wt 195.0 lb

## 2013-03-30 DIAGNOSIS — H04129 Dry eye syndrome of unspecified lacrimal gland: Secondary | ICD-10-CM

## 2013-03-30 DIAGNOSIS — G809 Cerebral palsy, unspecified: Secondary | ICD-10-CM

## 2013-03-30 DIAGNOSIS — M25569 Pain in unspecified knee: Secondary | ICD-10-CM

## 2013-03-30 DIAGNOSIS — Z23 Encounter for immunization: Secondary | ICD-10-CM

## 2013-03-30 DIAGNOSIS — M25562 Pain in left knee: Secondary | ICD-10-CM

## 2013-03-30 DIAGNOSIS — I10 Essential (primary) hypertension: Secondary | ICD-10-CM

## 2013-03-30 DIAGNOSIS — H1045 Other chronic allergic conjunctivitis: Secondary | ICD-10-CM

## 2013-03-30 DIAGNOSIS — H04123 Dry eye syndrome of bilateral lacrimal glands: Secondary | ICD-10-CM

## 2013-03-30 DIAGNOSIS — H5789 Other specified disorders of eye and adnexa: Secondary | ICD-10-CM | POA: Insufficient documentation

## 2013-03-30 MED ORDER — OLOPATADINE HCL 0.2 % OP SOLN: OPHTHALMIC | Status: AC

## 2013-03-30 MED ORDER — MELOXICAM 7.5 MG PO TABS: 7.5000 mg | ORAL_TABLET | Freq: Every day | ORAL | Status: DC

## 2013-03-30 NOTE — Assessment & Plan Note (Addendum)
Stable/Well Controlled - no changes at this time. Return in 3 months >Consider combination versus eval for secondary causes

## 2013-03-30 NOTE — Assessment & Plan Note (Signed)
Refill Pataday Return to Dr. Demetrius Charity

## 2013-03-30 NOTE — Assessment & Plan Note (Signed)
Start mobic prn No further workup with no identifiable problems

## 2013-03-30 NOTE — Patient Instructions (Signed)
It was nice to see you today, thanks for coming in!  1. Essential hypertension, benign Keep taking the same BP meds  2. Knee pain, left Changing medicine from Tramadol to Mobic as needed   Dry eye, bilateral - Olopatadine HCl (PATADAY) 0.2 % SOLN; One drop in each eye daily.  Dispense: 1 Bottle; Refill: 11    Please plan to return to see me 3 months.    If you need anything prior to your next visit please call the clinic. Please Bring all medications or accurate medication list with you to each appointment; an accurate medication list is essential in providing you the best care possible.

## 2013-03-30 NOTE — Assessment & Plan Note (Signed)
Minimally verbal and interactive

## 2013-03-30 NOTE — Progress Notes (Signed)
[facility] HERRICK HARTOG - 32 y.o. male MRN [id number]  Date of birth: 12-11-1980  CC & Reason for Visit  Glen Bauer presents today for Annual Exam  Pertinent History & Care Coordination   "Negee's" major active medical problems include: # CP: Low functioning, minimally verbal # CVD (HTN, Obesity): moderately controlled - poor food preferences # ASTHMA: only occasional attacks per year # Knee Pain: Left - not significantly limiting XRay with OA Other Care Providers include:  Opthalmology - Dr. Demetrius Charity  Brief Social History:  Lives with mother  Dependent care  Other Pertinent Med/Surg/Hosp History: # Colostomy at [employer] s/p reversal  # Hx of Dry Eye - on lubricating drops  Otherwise please see associated EMR sections for complete problem List, past medical history, past surgical history, family history and social history. HPI, Interval History & ROS  Glen Bauer is here today to followup on the chronic medical conditions as outlined above and in the assessment and plan.    Mom reports he is otherwise doing well.    Denies any syncopal spells or alterations in behaviors  Occasional seems to indicate that his R knee is bothering him, tramadol seems to help  Otherwise history is difficult to illicit  Objective Findings   VITALS: HR: 69 bpm  BP: 138/76 mmHg  TEMP: 99.8 F (37.7 C) (Oral)  RESP:   HT:    WT: 195 lb (88.451 kg)  BMI: Body mass index is 38.08 kg/(m^2).   BP Readings from Last 3 Encounters:  03/30/13 138/76  12/16/11 132/88  11/11/11 158/100   Wt Readings from Last 3 Encounters:  03/30/13 195 lb (88.451 kg)  12/16/11 201 lb (91.173 kg)  11/11/11 202 lb 14.4 oz (92.035 kg)     PHYSICAL EXAM: GENERAL: Adult obese AA  male. In no discomfort; no respiratory distress  PSYCH: alert and interactive but inappropriate and does not follow instructions   HNEENT: H&N: AT/[place], trachea midline  Eyes: no scleral icterus, no conjunctival exudate   Ears: TM normal, no effusion  Nose: No rhinorrhea  Oropharynx: MMM  Dentention:     CARDIO: RRR, S1/S2 heard, no murmur  LUNGS: CTA B, no wheezes, no crackles  ABDOMEN:   EXTREM:  L Knee Exam: Appear:  normal appearing  Palp:  no obvious TTP over joint line, no crepitation appreciated  ROM:  Full  NV:   knee flexion/extension strength 5+/5  Testing:  ligaments intact    GU:   SKIN: No rash   Medications & Orders   Previous Medications   ALBUTEROL (PROVENTIL) (2.5 MG/3ML) 0.083% NEBULIZER SOLUTION    Use 3mL q4h for cough/wheeze/SOB. Include all nebulizer machine and supplies.   AMLODIPINE (NORVASC) 10 MG TABLET    TAKE 1 TABLET BY MOUTH EVERY DAY   BENAZEPRIL (LOTENSIN) 40 MG TABLET    TAKE 1 TABLET BY MOUTH EVERY DAY   DIPHENHYDRAMINE (BENADRYL) 25 MG CAPSULE    Take 1 capsule (25 mg total) by mouth every 6 (six) hours as needed for itching or allergies.   HYDROCHLOROTHIAZIDE (HYDRODIURIL) 25 MG TABLET    TAKE 1 TABLET BY MOUTH DAILY   METOPROLOL SUCCINATE (TOPROL-XL) 100 MG 24 HR TABLET    TAKE 1 TABLET BY MOUTH EVERY DAY   Modified Medications   Modified Medication Previous Medication   OLOPATADINE HCL (PATADAY) 0.2 % SOLN Olopatadine HCl (PATADAY) 0.2 % SOLN      One drop in each eye daily.    One drop in  each eye daily.   New Prescriptions   MELOXICAM (MOBIC) 7.5 MG TABLET    Take 1 tablet (7.5 mg total) by mouth daily.   Discontinued Medications   TRAMADOL (ULTRAM) 50 MG TABLET    TAKE 1 TABLET BY MOUTH EVERY 8 HOURS AS NEEDED FOR PAIN  No orders of the defined types were placed in this encounter.    Assessment & Plan    Problems addressed today: General Instructions:  1. Essential hypertension, benign   2. Knee pain, left   3. CEREBRAL PALSY   4. CONJUNCTIVITIS, ALLERGIC   5. Dry eye, bilateral   6. Need for prophylactic vaccination and inoculation against influenza     See Problem oriented charting  Flu Shot today

## 2013-05-22 ENCOUNTER — Other Ambulatory Visit: Payer: Self-pay | Admitting: Sports Medicine

## 2013-05-22 ENCOUNTER — Other Ambulatory Visit: Payer: Self-pay | Admitting: Family Medicine

## 2013-06-26 ENCOUNTER — Other Ambulatory Visit: Payer: Self-pay | Admitting: Sports Medicine

## 2013-06-29 ENCOUNTER — Other Ambulatory Visit: Payer: Self-pay | Admitting: Sports Medicine

## 2013-07-14 ENCOUNTER — Other Ambulatory Visit: Payer: Self-pay | Admitting: Sports Medicine

## 2013-07-28 ENCOUNTER — Other Ambulatory Visit: Payer: Self-pay | Admitting: Sports Medicine

## 2013-08-29 ENCOUNTER — Other Ambulatory Visit: Payer: Self-pay | Admitting: Sports Medicine

## 2013-09-13 ENCOUNTER — Other Ambulatory Visit: Payer: Self-pay | Admitting: Sports Medicine

## 2013-09-30 ENCOUNTER — Other Ambulatory Visit: Payer: Self-pay | Admitting: Sports Medicine

## 2013-09-30 ENCOUNTER — Other Ambulatory Visit: Payer: Self-pay | Admitting: Family Medicine

## 2013-10-16 ENCOUNTER — Other Ambulatory Visit: Payer: Self-pay | Admitting: Sports Medicine

## 2013-11-07 ENCOUNTER — Other Ambulatory Visit: Payer: Self-pay | Admitting: Sports Medicine

## 2013-12-01 ENCOUNTER — Other Ambulatory Visit: Payer: Self-pay | Admitting: Sports Medicine

## 2013-12-05 ENCOUNTER — Other Ambulatory Visit: Payer: Self-pay | Admitting: *Deleted

## 2013-12-05 MED ORDER — MELOXICAM 7.5 MG PO TABS: 7.5000 mg | ORAL_TABLET | Freq: Every day | ORAL | Status: DC

## 2013-12-08 ENCOUNTER — Other Ambulatory Visit: Payer: Self-pay | Admitting: Sports Medicine

## 2013-12-26 ENCOUNTER — Other Ambulatory Visit: Payer: Self-pay | Admitting: Sports Medicine

## 2013-12-26 ENCOUNTER — Ambulatory Visit: Payer: Medicare Other | Admitting: Sports Medicine

## 2014-01-08 ENCOUNTER — Other Ambulatory Visit: Payer: Self-pay | Admitting: Sports Medicine

## 2014-01-09 ENCOUNTER — Ambulatory Visit: Payer: Medicare Other | Admitting: Sports Medicine

## 2014-01-27 IMAGING — CR DG KNEE COMPLETE 4+V*L*
5 series · 5 of 5 positions shown · non-contrast
Comparison: None

CLINICAL DATA: Knee pain and crepitus

LEFT KNEE - COMPLETE 4+ VIEW

[t knee ap left]
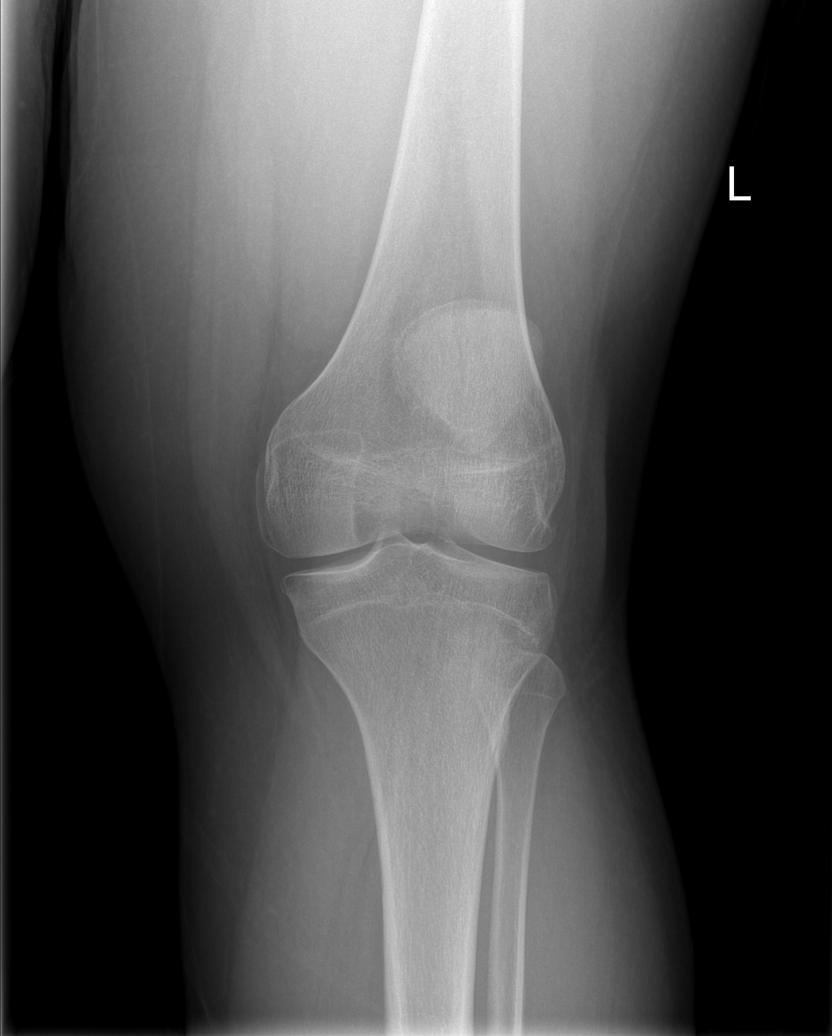

[t knee oblique left (1 of 3)]
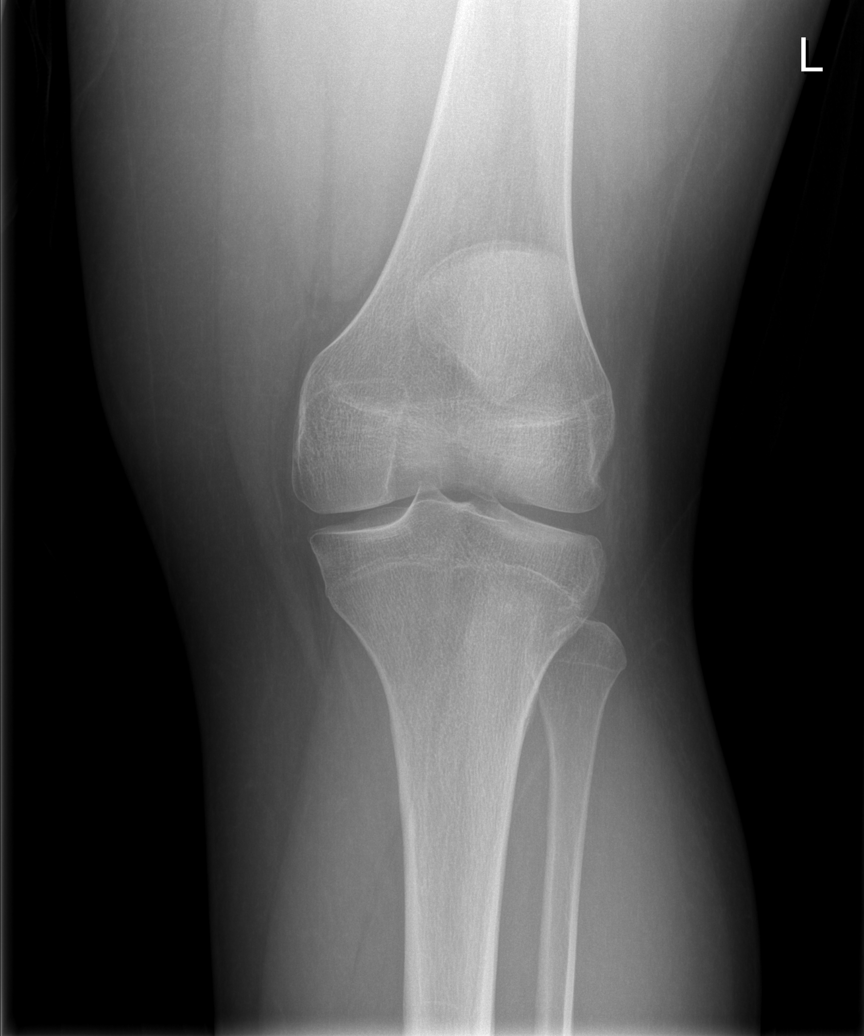

[t knee oblique left (2 of 3)]
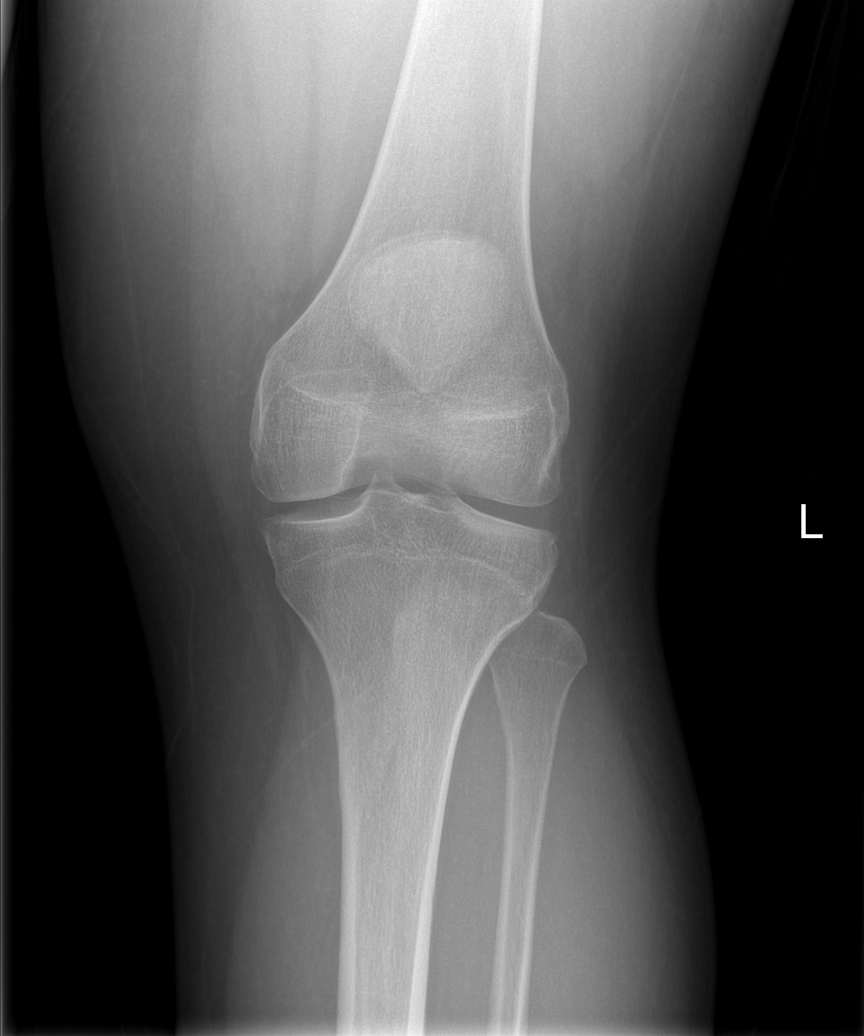

[t knee oblique left (3 of 3)]
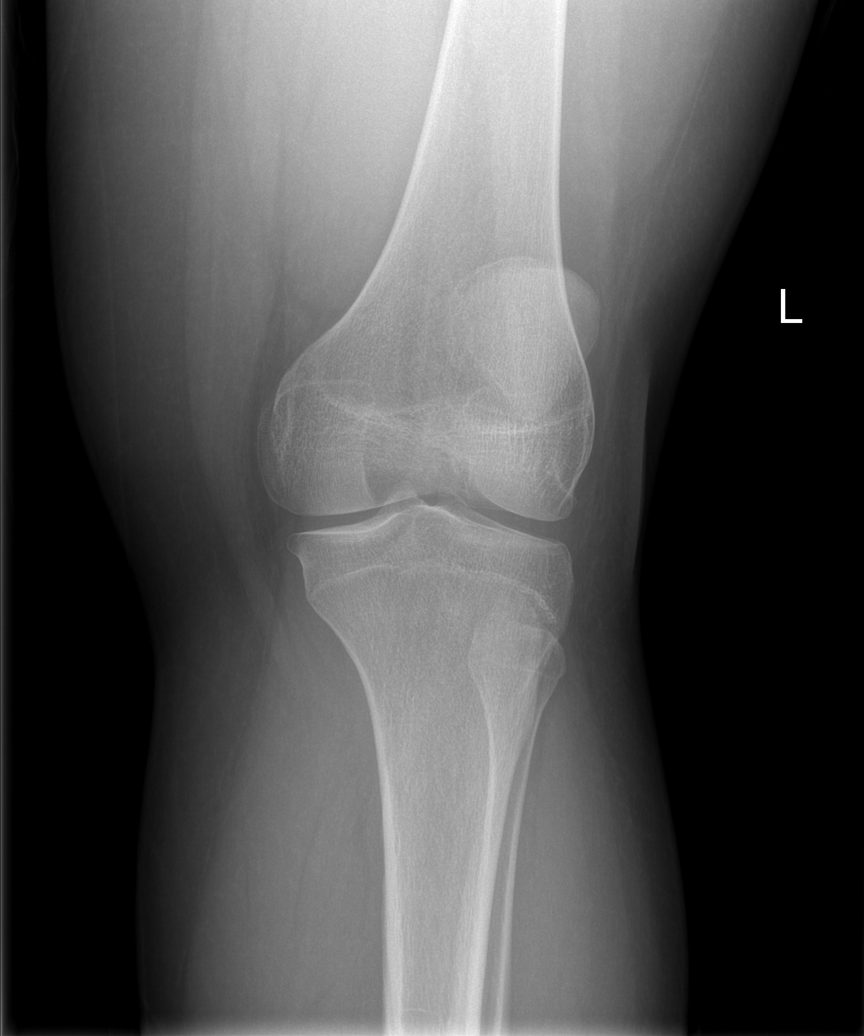

[t knee lat left]
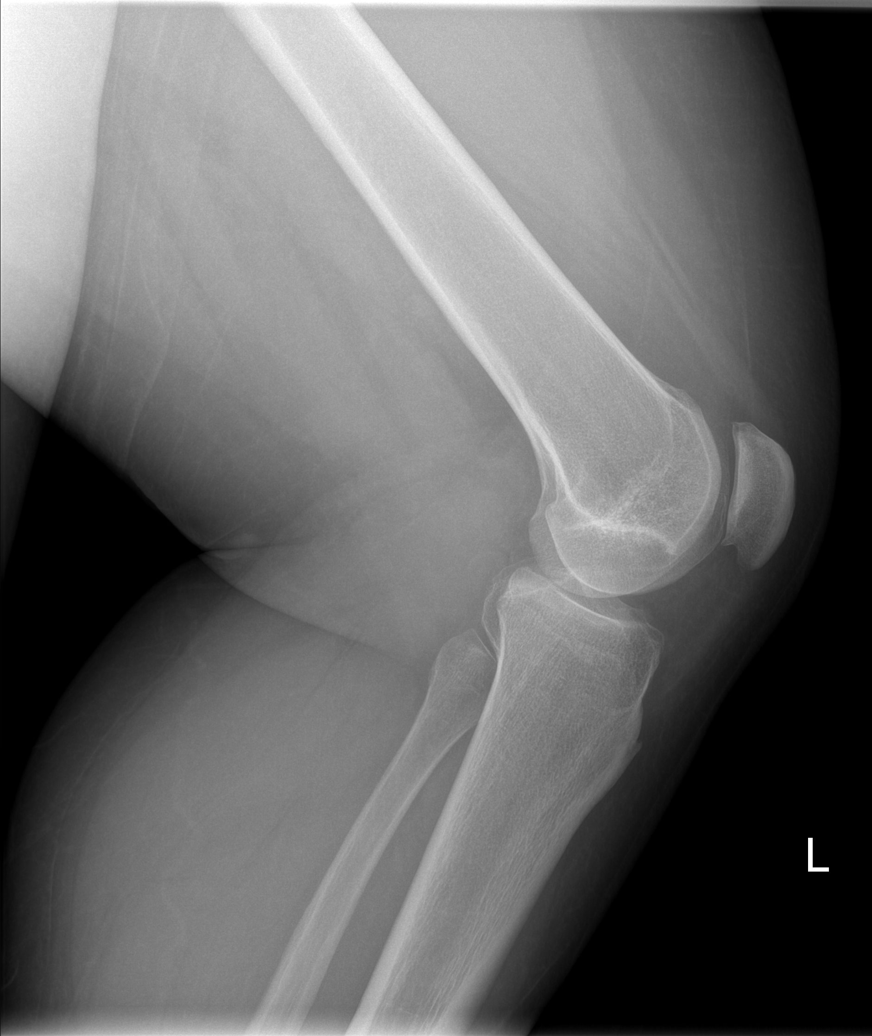

[5 of 5 positions shown; findings below may reference images not displayed]

FINDINGS: There is no joint effusion.

There is mild tricompartment osteoarthritis including medial
compartment narrowing and marginal spur formation.

There is mild sharpening of the tibial spines.

No radio-opaque foreign bodies or soft tissue calcifications
identified.
IMPRESSION: 1.  Mild osteoarthritis.
2.  No acute findings.

## 2014-01-29 ENCOUNTER — Encounter: Payer: Self-pay | Admitting: Family Medicine

## 2014-01-29 ENCOUNTER — Ambulatory Visit (INDEPENDENT_AMBULATORY_CARE_PROVIDER_SITE_OTHER): Payer: Medicare Other | Admitting: Family Medicine

## 2014-01-29 VITALS — BP 120/80 | HR 75 | Temp 98.7°F | Wt 188.0 lb

## 2014-01-29 DIAGNOSIS — I10 Essential (primary) hypertension: Secondary | ICD-10-CM

## 2014-01-29 DIAGNOSIS — E669 Obesity, unspecified: Secondary | ICD-10-CM

## 2014-01-29 DIAGNOSIS — L723 Sebaceous cyst: Secondary | ICD-10-CM

## 2014-01-29 NOTE — Assessment & Plan Note (Signed)
On scalp. Chronic, long-standing ( > 10 years). Advised to let hair grow out in an effort to see if he will stop scratching and reopening it. Discussed surgical excision which would be difficult given its size, location, and the pt's ability to remain still for extended periods of time. Benefits would only outweigh risks if this was truly bothersome to the patient.

## 2014-01-29 NOTE — Assessment & Plan Note (Addendum)
Chonic, stable, controlled. Continue metoprolol, HCTA, benazepril, amlodipine and weight loss efforts.

## 2014-01-29 NOTE — Progress Notes (Signed)
Patient ID: Ozro N Satcher, male   DOB: 07/31/1980, 33 y.o.   MJeanella CaraRN: [id number]   Subjective:   Jeanella CaraClarence N Worthington is a 33 y.o. male with a history of cerebral palsy, HTN here to management chronic issues.   He is accompanied by his caretaker, Maralyn SagoSarah. She reports no new behavioral or neurological issues. He has an area on the back of his head that has been there for > 10 years which has not resolved with topical treatments. She was told it's an infected hair. It is difficult to evaluate how bothersome this is for him but he does scratch it frequently. It does not seem painful. He has continued to get around normally.   Review of Systems:  Per HPI. All other systems reviewed and are negative.    Past Medical History: Patient Active Problem List   Diagnosis Date Noted  . Dry eye 03/30/2013  . Knee pain, left 03/18/2011  . Obesity 03/18/2011  . Asthma 09/23/2010  . CEREBRAL PALSY 09/09/2006  . HYPERTENSION, BENIGN SYSTEMIC 09/09/2006   Medications: reviewed and updated Current Outpatient Prescriptions  Medication Sig Dispense Refill  . albuterol (PROVENTIL) (2.5 MG/3ML) 0.083% nebulizer solution Use 3mL q4h for cough/wheeze/SOB. Include all nebulizer machine and supplies.  25 vial  2  . amLODipine (NORVASC) 10 MG tablet TAKE 1 TABLET BY MOUTH DAILY  30 tablet  0  . benazepril (LOTENSIN) 40 MG tablet TAKE 1 TABLET BY MOUTH EVERY DAY  30 tablet  11  . diphenhydrAMINE (BENADRYL) 25 mg capsule Take 1 capsule (25 mg total) by mouth every 6 (six) hours as needed for itching or allergies.  30 capsule  11  . hydrochlorothiazide (HYDRODIURIL) 25 MG tablet TAKE 1 TABLET BY MOUTH DAILY  30 tablet  5  . meloxicam (MOBIC) 7.5 MG tablet TAKE 1 TABLET BY MOUTH DAILY  30 tablet  0  . metoprolol succinate (TOPROL-XL) 100 MG 24 hr tablet TAKE 1 TABLET BY MOUTH DAILY  90 tablet  0  . Olopatadine HCl (PATADAY) 0.2 % SOLN One drop in each eye daily.  1 Bottle  11   No current facility-administered medications  for this visit.   Objective:  BP 120/80  Pulse 75  Temp(Src) 98.7 F (37.1 C) (Oral)  Wt 188 lb (85.276 kg)  Wt Readings from Last 3 Encounters:  01/29/14 188 lb (85.276 kg)  03/30/13 195 lb (88.451 kg)  12/16/11 201 lb (91.173 kg)   Gen: Non-verbal but gesticulating  33 y.o. male in NAD HEENT: MMM, slight strabismus, PERRL, anicteric sclerae, non-inflamed gingival hyperplasia CV: RRR, no MRG, no JVD Resp: Non-labored, CTAB, no wheezes noted Abd: Soft, NTND, BS present, no guarding or organomegaly MSK: No edema noted, full ROM, humpback Skin: 2cm x 3 cm transversely oriented Indurated area on the left occipital scalp overlaying semispinalis capitis m. At its head is a pink healing area less than 1 cm that is not actively draining.  Neuro: Alert, interactive, nearly non-verbal with steady gait.   Assessment:     Jeanella CaraClarence N Olarte is a 33 y.o. male here to establish care with new PCP and to follow up chronic medical issues.     Plan:     See problem list for problem-specific plans.

## 2014-01-29 NOTE — Assessment & Plan Note (Signed)
Improving. Down to 188 lbs with improved diet and activity.

## 2014-02-07 ENCOUNTER — Other Ambulatory Visit: Payer: Self-pay | Admitting: Sports Medicine

## 2014-02-09 ENCOUNTER — Other Ambulatory Visit: Payer: Self-pay | Admitting: *Deleted

## 2014-02-09 MED ORDER — BENAZEPRIL HCL 40 MG PO TABS: 40.0000 mg | ORAL_TABLET | Freq: Every day | ORAL | Status: DC

## 2014-02-14 ENCOUNTER — Telehealth: Payer: Self-pay | Admitting: Family Medicine

## 2014-02-14 NOTE — Telephone Encounter (Signed)
Refill request for benazepril,l meloxicam & amlodipine. [facility] and [facility]. Please send asap. Pt completely out.

## 2014-02-15 MED ORDER — BENAZEPRIL HCL 40 MG PO TABS: 40.0000 mg | ORAL_TABLET | Freq: Every day | ORAL | Status: DC

## 2014-02-15 MED ORDER — AMLODIPINE BESYLATE 10 MG PO TABS: ORAL_TABLET | ORAL | Status: DC

## 2014-02-15 MED ORDER — MELOXICAM 7.5 MG PO TABS: ORAL_TABLET | ORAL | Status: DC

## 2014-03-30 ENCOUNTER — Other Ambulatory Visit: Payer: Self-pay | Admitting: Sports Medicine

## 2014-05-12 ENCOUNTER — Other Ambulatory Visit: Payer: Self-pay | Admitting: Family Medicine

## 2014-05-14 ENCOUNTER — Other Ambulatory Visit: Payer: Self-pay | Admitting: *Deleted

## 2014-05-14 MED ORDER — AMLODIPINE BESYLATE 10 MG PO TABS: ORAL_TABLET | ORAL | Status: DC

## 2014-05-14 MED ORDER — MELOXICAM 7.5 MG PO TABS: ORAL_TABLET | ORAL | Status: DC

## 2014-05-14 NOTE — Telephone Encounter (Signed)
Attempted to call patient's sister to inform her that rx sent in

## 2014-06-04 ENCOUNTER — Other Ambulatory Visit: Payer: Self-pay | Admitting: Family Medicine

## 2014-06-04 MED ORDER — BENAZEPRIL HCL 40 MG PO TABS: 40.0000 mg | ORAL_TABLET | Freq: Every day | ORAL | Status: DC

## 2014-06-04 NOTE — Progress Notes (Signed)
-----   Message from Tyrone Nineyan B Grunz, MD sent at 06/04/2014 11:27 AM EST -----    Can you please call his guardian and tell them I have refilled the benazepril and that he should make an appointment to have labs drawn and to recheck blood pressure. Thank you!       LMOVM for Maralyn SagoSarah to callback.  Please inform of the above message from MD. Milas GainFleeger, Maryjo RochesterJessica Dawn

## 2014-06-12 ENCOUNTER — Other Ambulatory Visit: Payer: Self-pay | Admitting: Family Medicine

## 2014-07-09 ENCOUNTER — Other Ambulatory Visit: Payer: Self-pay | Admitting: Family Medicine

## 2014-08-01 ENCOUNTER — Other Ambulatory Visit: Payer: Self-pay | Admitting: Family Medicine

## 2014-08-01 NOTE — Telephone Encounter (Signed)
Could you please call to have him come in for an appointment including blood work (has not had this since 2013). I cannot refill his medications until we check his blood. Thanks!

## 2014-08-02 NOTE — Telephone Encounter (Signed)
LM for mother of pt to return call to give her the below message.  Told the person that we would be out of the office on Friday and would be back on Monday. Lamonte SakaiZimmerman Rumple, April D

## 2014-08-07 ENCOUNTER — Other Ambulatory Visit: Payer: Self-pay | Admitting: *Deleted

## 2014-08-08 MED ORDER — HYDROCHLOROTHIAZIDE 25 MG PO TABS: 25.0000 mg | ORAL_TABLET | Freq: Every day | ORAL | Status: DC

## 2014-08-08 NOTE — Telephone Encounter (Signed)
Spoke with pt's mom.  Informed her that HCTZ was filled, but pt will need a follow up appt.  She stated she will call back for the appt.  Clovis PuMartin, Tamika L, RN

## 2014-08-08 NOTE — Telephone Encounter (Signed)
Can you please have him make an appointment for labs (the last labs he has are from mid 2013) and BP follow up? I am ok'ing the refill of HCTZ.

## 2014-08-10 NOTE — Telephone Encounter (Signed)
Tried to call pt and speak with his mother but she was not there left message for her to return call.  Also tried another number we have for her, and it has been disconnected.  Just trying to let her know that her son needs to come in for an appointment and blood work. (has not had since 2013) Dr. Jarvis NewcomerGrunz said he cannot refill his medications until his blood work is done. Glen Bauer, Glen Bauer

## 2014-08-30 ENCOUNTER — Other Ambulatory Visit: Payer: Self-pay | Admitting: Family Medicine

## 2014-09-14 ENCOUNTER — Other Ambulatory Visit: Payer: Self-pay | Admitting: Family Medicine

## 2014-10-20 ENCOUNTER — Other Ambulatory Visit: Payer: Self-pay | Admitting: Family Medicine

## 2014-11-23 ENCOUNTER — Ambulatory Visit (INDEPENDENT_AMBULATORY_CARE_PROVIDER_SITE_OTHER): Payer: Medicare Other | Admitting: Family Medicine

## 2014-11-23 VITALS — BP 120/63 | HR 90 | Temp 98.7°F | Ht 60.0 in | Wt 196.6 lb

## 2014-11-23 DIAGNOSIS — I10 Essential (primary) hypertension: Secondary | ICD-10-CM | POA: Diagnosis not present

## 2014-11-23 DIAGNOSIS — Z5181 Encounter for therapeutic drug level monitoring: Secondary | ICD-10-CM | POA: Diagnosis not present

## 2014-11-23 DIAGNOSIS — M1612 Unilateral primary osteoarthritis, left hip: Secondary | ICD-10-CM | POA: Diagnosis not present

## 2014-11-23 LAB — CBC
HEMATOCRIT: 47.1 % (ref 39.0–52.0)
Hemoglobin: 16.1 g/dL (ref 13.0–17.0)
MCH: 28.8 pg (ref 26.0–34.0)
MCHC: 34.2 g/dL (ref 30.0–36.0)
MCV: 84.1 fL (ref 78.0–100.0)
MPV: 10.2 fL (ref 8.6–12.4)
Platelets: 281 10*3/uL (ref 150–400)
RBC: 5.6 MIL/uL (ref 4.22–5.81)
RDW: 15.2 % (ref 11.5–15.5)
WBC: 5.3 10*3/uL (ref 4.0–10.5)

## 2014-11-23 LAB — BASIC METABOLIC PANEL
BUN: 10 mg/dL (ref 6–23)
CHLORIDE: 102 meq/L (ref 96–112)
CO2: 26 mEq/L (ref 19–32)
Calcium: 9.4 mg/dL (ref 8.4–10.5)
Creat: 0.83 mg/dL (ref 0.50–1.35)
Glucose, Bld: 86 mg/dL (ref 70–99)
Potassium: 3.7 mEq/L (ref 3.5–5.3)
SODIUM: 138 meq/L (ref 135–145)

## 2014-11-23 MED ORDER — BENAZEPRIL HCL 40 MG PO TABS: 40.0000 mg | ORAL_TABLET | Freq: Every day | ORAL | Status: DC

## 2014-11-23 MED ORDER — MELOXICAM 7.5 MG PO TABS: 7.5000 mg | ORAL_TABLET | Freq: Every day | ORAL | Status: DC

## 2014-11-26 ENCOUNTER — Other Ambulatory Visit: Payer: Self-pay | Admitting: Family Medicine

## 2014-11-30 NOTE — Progress Notes (Signed)
Patient ID: Glen CaraClarence N Bauer, male   DOB: Jun 21, 1981, 34 y.o.   MRN: [id number]   Subjective:   Glen Bauer is a 34 y.o. male with a history of cerebral palsy and HTN here for follow up.  He is accompanied by his mother, Maralyn SagoSarah. She reports no new symptoms since our last visit. He has continued on norvasc, benazepril, metoprolol, and HCTZ for HTN without complaints of cough, CP, SOB, syncope, palpitations, orthopnea, leg swelling. 100% compliant. He is generally fed low salt meals but does not exercise much.  ROS: Further denies fever, headache, abdominal pain, N/V/D, blood in stool, change in bladder habits, myalgias, arthralgias, and rash.   Past Medical History: Patient Active Problem List   Diagnosis Date Noted  . Sebaceous cyst 01/29/2014  . Dry eye 03/30/2013  . Knee pain, left 03/18/2011  . Obesity 03/18/2011  . Asthma 09/23/2010  . CEREBRAL PALSY 09/09/2006  . HYPERTENSION, BENIGN SYSTEMIC 09/09/2006   Medications: reviewed and updated Medication Dispense Refill  . albuterol (PROVENTIL) (2.5 MG/3ML) 0.083% nebulizer solution 25 vial 2  . amLODipine (NORVASC) 10 MG tablet 90 tablet 0  . benazepril (LOTENSIN) 40 MG tablet 90 tablet 3  . hydrochlorothiazide (HYDRODIURIL) 25 MG tablet 30 tablet 5  . meloxicam (MOBIC) 7.5 MG tablet 60 tablet 0  . metoprolol succinate (TOPROL-XL) 100 MG 24 hr tablet 90 tablet 3  . Olopatadine HCl (PATADAY) 0.2 % SOLN 1 Bottle 11    Objective:  BP 120/63 mmHg  Pulse 90  Temp(Src) 98.7 F (37.1 C) (Oral)  Ht 5' (1.524 m)  Wt 196 lb 9.6 oz (89.177 kg)  BMI 38.40 kg/m2  Wt Readings from Last 3 Encounters:  11/23/14 196 lb 9.6 oz (89.177 kg)  01/29/14 188 lb (85.276 kg)  03/30/13 195 lb (88.451 kg)   Gen: Agreeable, gesticulating 34 y.o. male in NAD HEENT: MMM, slight strabismus, PERRL, anicteric sclerae, non-inflamed gingival hyperplasia again noted. CV: RRR, no MRG, no JVD Resp: Non-labored, CTAB, no wheezes noted Abd: Soft, NTND,  BS present, no guarding or organomegaly MSK: No edema noted, full ROM, humpback Skin: No rashes or wounds.  Neuro: Alert, interactive, nearly non-verbal with steady gait. Significant intellectual disability.   Assessment/Plan:  Glen Bauer is a 34 y.o. male here for follow up chronic medical issues.  - See problem list for problem-specific plans.

## 2014-11-30 NOTE — Assessment & Plan Note (Signed)
Chronic, well-controlled on 4 drug regimen. Will continue these - labs today. Reinforced need for increased physical activity and weight loss. DASH diet discussed, handout given.

## 2014-11-30 NOTE — Patient Instructions (Signed)
We made no medication changes today:  - High blood pressure is best treated by losing weight, taking medications as directed, avoiding salt in your diet and increasing potassium from fruits and vegetables (called the DASH diet). - Follow up in 12 months, or sooner if you have any concerns. If you feel faint, experience new/worsening chest pain or shortness of breath, or notice rapid leg swelling and/or weight gain you should call the clinic at [phone] OR go directly to the ER. - We will contact you with the results of your labs.   Take care,  - Dr. Jarvis NewcomerGrunz  DASH Eating Plan DASH stands for "Dietary Approaches to Stop Hypertension." The DASH eating plan is a healthy eating plan that has been shown to reduce high blood pressure (hypertension). Additional health benefits may include reducing the risk of type 2 diabetes mellitus, heart disease, and stroke. The DASH eating plan may also help with weight loss. WHAT DO I NEED TO KNOW ABOUT THE DASH EATING PLAN? For the DASH eating plan, you will follow these general guidelines:  Choose foods with a percent daily value for sodium of less than 5% (as listed on the food label).  Use salt-free seasonings or herbs instead of table salt or sea salt.  Check with your health care provider or pharmacist before using salt substitutes.  Eat lower-sodium products, often labeled as "lower sodium" or "no salt added."  Eat fresh foods.  Eat more vegetables, fruits, and low-fat dairy products.  Choose whole grains. Look for the word "whole" as the first word in the ingredient list.  Choose fish and skinless chicken or Malaw[place]urkey more often than red meat. Limit fish, poultry, and meat to 6 oz (170 g) each day.  Limit sweets, desserts, sugars, and sugary drinks.  Choose heart-healthy fats.  Limit cheese to 1 oz (28 g) per day.  Eat more home-cooked food and less restaurant, buffet, and fast food.  Limit fried foods.  Cook foods using methods other than  frying.  Limit canned vegetables. If you do use them, rinse them well to decrease the sodium.  When eating at a restaurant, ask that your food be prepared with less salt, or no salt if possible. WHAT FOODS CAN I EAT? Seek help from a dietitian for individual calorie needs. Grains Whole grain or whole wheat bread. Brown rice. Whole grain or whole wheat pasta. Quinoa, bulgur, and whole grain cereals. Low-sodium cereals. Corn or whole wheat flour tortillas. Whole grain cornbread. Whole grain crackers. Low-sodium crackers. Vegetables Fresh or frozen vegetables (raw, steamed, roasted, or grilled). Low-sodium or reduced-sodium tomato and vegetable juices. Low-sodium or reduced-sodium tomato sauce and paste. Low-sodium or reduced-sodium canned vegetables.  Fruits All fresh, canned (in natural juice), or frozen fruits. Meat and Other Protein Products Ground beef (85% or leaner), grass-fed beef, or beef trimmed of fat. Skinless chicken or Mal[place]urkey. Ground chicken or [place]urkey. Pork trimmed of fat. All fish and seafood. Eggs. Dried beans, peas, or lentils. Unsalted nuts and seeds. Unsalted canned beans. Dairy Low-fat dairy products, such as skim or 1% milk, 2% or reduced-fat cheeses, low-fat ricotta or cottage cheese, or plain low-fat yogurt. Low-sodium or reduced-sodium cheeses. Fats and Oils Tub margarines without trans fats. Light or reduced-fat mayonnaise and salad dressings (reduced sodium). Avocado. Safflower, olive, or canola oils. Natural peanut or almond butter. Other Unsalted popcorn and pretzels.  WHAT FOODS ARE NOT RECOMMENDED? Grains [place] bread. [place] pasta. [place] rice. Refined cornbread. Bagels and croissants. Crackers that contain trans fat. Vegetables  Creamed or fried vegetables. Vegetables in a cheese sauce. Regular canned vegetables. Regular canned tomato sauce and paste. Regular tomato and vegetable juices. Fruits Dried fruits. Canned fruit in light or heavy syrup. Fruit juice. Meat  and Other Protein Products Fatty cuts of meat. Ribs, chicken wings, bacon, sausage, bologna, salami, chitterlings, fatback, hot dogs, bratwurst, and packaged luncheon meats. Salted nuts and seeds. Canned beans with salt. Dairy Whole or 2% milk, cream, half-and-half, and cream cheese. Whole-fat or sweetened yogurt. Full-fat cheeses or blue cheese. Nondairy creamers and whipped toppings. Processed cheese, cheese spreads, or cheese curds. Condiments Onion and garlic salt, seasoned salt, table salt, and sea salt. Canned and packaged gravies. Worcestershire sauce. Tartar sauce. Barbecue sauce. Teriyaki sauce. Soy sauce, including reduced sodium. Steak sauce. Fish sauce. Oyster sauce. Cocktail sauce. Horseradish. Ketchup and mustard. Meat flavorings and tenderizers. Bouillon cubes. Hot sauce. Tabasco sauce. Marinades. Taco seasonings. Relishes. Fats and Oils Butter, stick margarine, lard, shortening, ghee, and bacon fat. Coconut, palm kernel, or palm oils. Regular salad dressings. Other Pickles and olives. Salted popcorn and pretzels. The items listed above may not be a complete list of foods and beverages to avoid. Contact your dietitian for more information. WHERE CAN I FIND MORE INFORMATION? National Heart, Lung, and Blood Institute: CablePromo.itwww.nhlbi.nih.gov/health/health-topics/topics/dash/

## 2015-01-15 DIAGNOSIS — H5210 Myopia, unspecified eye: Secondary | ICD-10-CM | POA: Diagnosis not present

## 2015-01-15 DIAGNOSIS — H3532 Exudative age-related macular degeneration: Secondary | ICD-10-CM | POA: Diagnosis not present

## 2015-01-29 ENCOUNTER — Other Ambulatory Visit: Payer: Self-pay | Admitting: Family Medicine

## 2015-05-06 ENCOUNTER — Other Ambulatory Visit: Payer: Self-pay | Admitting: Family Medicine

## 2015-05-31 ENCOUNTER — Other Ambulatory Visit: Payer: Self-pay | Admitting: Family Medicine

## 2015-06-13 ENCOUNTER — Other Ambulatory Visit: Payer: Self-pay | Admitting: Family Medicine

## 2015-07-04 ENCOUNTER — Other Ambulatory Visit: Payer: Self-pay | Admitting: Family Medicine

## 2015-07-04 DIAGNOSIS — I10 Essential (primary) hypertension: Secondary | ICD-10-CM

## 2015-07-28 ENCOUNTER — Other Ambulatory Visit: Payer: Self-pay | Admitting: Family Medicine

## 2015-08-09 ENCOUNTER — Other Ambulatory Visit: Payer: Self-pay | Admitting: Family Medicine

## 2015-08-12 NOTE — Telephone Encounter (Signed)
Letter mailed to patient to follow up. Jazmin Hartsell,CMA  

## 2015-08-30 ENCOUNTER — Other Ambulatory Visit: Payer: Self-pay | Admitting: Family Medicine

## 2015-10-02 ENCOUNTER — Other Ambulatory Visit: Payer: Self-pay | Admitting: Family Medicine

## 2015-10-03 ENCOUNTER — Ambulatory Visit: Payer: Medicare Other | Admitting: Family Medicine

## 2015-10-11 ENCOUNTER — Encounter: Payer: Self-pay | Admitting: Family Medicine

## 2015-10-11 ENCOUNTER — Ambulatory Visit (INDEPENDENT_AMBULATORY_CARE_PROVIDER_SITE_OTHER): Payer: Medicare Other | Admitting: Family Medicine

## 2015-10-11 VITALS — BP 117/77 | HR 69 | Temp 98.7°F | Ht 60.0 in | Wt 187.0 lb

## 2015-10-11 DIAGNOSIS — I10 Essential (primary) hypertension: Secondary | ICD-10-CM | POA: Diagnosis not present

## 2015-10-11 DIAGNOSIS — E669 Obesity, unspecified: Secondary | ICD-10-CM | POA: Diagnosis present

## 2015-10-16 NOTE — Assessment & Plan Note (Signed)
Well controlled on HCTZ, norvasc, benazepril and metoprolol without orthostasis or adverse drug effects.  - No changes to regimen at this time.  - Renal function historically stable, wnl at last check. Will defer recheck to next office visit given his aversion to lab draws. - Primary elements of DASH diet and weight loss strategies reviewed in detail. Handout given.

## 2015-10-16 NOTE — Progress Notes (Signed)
Subjective: Jeanella CaraClarence N Hillery is a 35 y.o. male accompanied by his mother here for hypertension follow up.   He does not check BP at home. Medications are administered by his mother with good compliance and no noted adverse effects. He does not exercise and is not very physically active. Prefers only foods high in fat and sugar.   - ROS: Denies CP, SOB, palpitations, syncope, dizziness, orthopnea, PND, frequent headaches, vision changes, claudication, leg swelling. No orthostasis.  - PMFSH: Non-smoker with CP lives with mother, no EtOH, no illicit drugs.  Objective: BP 117/77 mmHg  Pulse 69  Temp(Src) 98.7 F (37.1 C) (Oral)  Ht 5' (1.524 m)  Wt 187 lb (84.823 kg)  BMI 36.52 kg/m2 Gen: Well-appearing 34 y.o.male in no distress Neck: Supple, no bruits; thyroid not enlarged  Pulm: Non-labored breathing room air; CTAB CV: Regular rate with normal S1/S2, no murmur; no LE edema, no JVD; DP and radial pulses symmetric and 2+. Homan's sign absent. cap refill < 2 sec. GI: Soft, obese, nontender, nondistended, no HSM.  Assessment & Plan: Jeanella CaraClarence N Woodin is a 35 y.o. male here for hypertension.   Obesity Stable. Due to poor food preferences, not amenable to nutritional counseling. Discussed strategies (buying healthy options) with his Mother today.  - Would check lipid panel and Hb A1c at next office visit. Will also need one-time HIV screening (very low risk pt) at that time.   HYPERTENSION, BENIGN SYSTEMIC Well controlled on HCTZ, norvasc, benazepril and metoprolol without orthostasis or adverse drug effects.  - No changes to regimen at this time.  - Renal function historically stable, wnl at last check. Will defer recheck to next office visit given his aversion to lab draws. - Primary elements of DASH diet and weight loss strategies reviewed in detail. Handout given.

## 2015-10-16 NOTE — Assessment & Plan Note (Signed)
Stable. Due to poor food preferences, not amenable to nutritional counseling. Discussed strategies (buying healthy options) with his Mother today.  - Would check lipid panel and Hb A1c at next office visit. Will also need one-time HIV screening (very low risk pt) at that time.

## 2015-10-16 NOTE — Patient Instructions (Signed)
Our clinic's number is [phone]. Feel free to call any time with questions or concerns. We will answer any questions after hours with our 24-hour emergency line at that number as well.   Take care,  - Dr. Jarvis NewcomerGrunz  DASH Eating Plan DASH stands for "Dietary Approaches to Stop Hypertension." The DASH eating plan is a healthy eating plan that has been shown to reduce high blood pressure (hypertension). Additional health benefits may include reducing the risk of type 2 diabetes mellitus, heart disease, and stroke. The DASH eating plan may also help with weight loss. WHAT DO I NEED TO KNOW ABOUT THE DASH EATING PLAN? For the DASH eating plan, you will follow these general guidelines:  Choose foods with a percent daily value for sodium of less than 5% (as listed on the food label).  Use salt-free seasonings or herbs instead of table salt or sea salt.  Check with your health care provider or pharmacist before using salt substitutes.  Eat lower-sodium products, often labeled as "lower sodium" or "no salt added."  Eat fresh foods.  Eat more vegetables, fruits, and low-fat dairy products.  Choose whole grains. Look for the word "whole" as the first word in the ingredient list.  Choose fish and skinless chicken or [place]urkey more often than red meat. Limit fish, poultry, and meat to 6 oz (170 g) each day.  Limit sweets, desserts, sugars, and sugary drinks.  Choose heart-healthy fats.  Limit cheese to 1 oz (28 g) per day.  Eat more home-cooked food and less restaurant, buffet, and fast food.  Limit fried foods.  Cook foods using methods other than frying.  Limit canned vegetables. If you do use them, rinse them well to decrease the sodium.  When eating at a restaurant, ask that your food be prepared with less salt, or no salt if possible. WHAT FOODS CAN I EAT? Seek help from a dietitian for individual calorie needs. Grains Whole grain or whole wheat bread. Brown rice. Whole grain or  whole wheat pasta. Quinoa, bulgur, and whole grain cereals. Low-sodium cereals. Corn or whole wheat flour tortillas. Whole grain cornbread. Whole grain crackers. Low-sodium crackers. Vegetables Fresh or frozen vegetables (raw, steamed, roasted, or grilled). Low-sodium or reduced-sodium tomato and vegetable juices. Low-sodium or reduced-sodium tomato sauce and paste. Low-sodium or reduced-sodium canned vegetables.  Fruits All fresh, canned (in natural juice), or frozen fruits. Meat and Other Protein Products Ground beef (85% or leaner), grass-fed beef, or beef trimmed of fat. Skinless chicken or [place]urkey. Ground chicken or [place]urkey. Pork trimmed of fat. All fish and seafood. Eggs. Dried beans, peas, or lentils. Unsalted nuts and seeds. Unsalted canned beans. Dairy Low-fat dairy products, such as skim or 1% milk, 2% or reduced-fat cheeses, low-fat ricotta or cottage cheese, or plain low-fat yogurt. Low-sodium or reduced-sodium cheeses. Fats and Oils Tub margarines without trans fats. Light or reduced-fat mayonnaise and salad dressings (reduced sodium). Avocado. Safflower, olive, or canola oils. Natural peanut or almond butter. Other Unsalted popcorn and pretzels.  WHAT FOODS ARE NOT RECOMMENDED? Grains [place]z bread. [place] pasta. [place] rice. Refined cornbread. Bagels and croissants. Crackers that contain trans fat. Vegetables Creamed or fried vegetables. Vegetables in a cheese sauce. Regular canned vegetables. Regular canned tomato sauce and paste. Regular tomato and vegetable juices. Fruits Dried fruits. Canned fruit in light or heavy syrup. Fruit juice. Meat and Other Protein Products Fatty cuts of meat. Ribs, chicken wings, bacon, sausage, bologna, salami, chitterlings, fatback, hot dogs, bratwurst, and packaged luncheon meats. Salted nuts  and seeds. Canned beans with salt. Dairy Whole or 2% milk, cream, half-and-half, and cream cheese. Whole-fat or sweetened yogurt. Full-fat cheeses or blue  cheese. Nondairy creamers and whipped toppings. Processed cheese, cheese spreads, or cheese curds. Condiments Onion and garlic salt, seasoned salt, table salt, and sea salt. Canned and packaged gravies. Worcestershire sauce. Tartar sauce. Barbecue sauce. Teriyaki sauce. Soy sauce, including reduced sodium. Steak sauce. Fish sauce. Oyster sauce. Cocktail sauce. Horseradish. Ketchup and mustard. Meat flavorings and tenderizers. Bouillon cubes. Hot sauce. Tabasco sauce. Marinades. Taco seasonings. Relishes. Fats and Oils Butter, stick margarine, lard, shortening, ghee, and bacon fat. Coconut, palm kernel, or palm oils. Regular salad dressings. Other Pickles and olives. Salted popcorn and pretzels. The items listed above may not be a complete list of foods and beverages to avoid. Contact your dietitian for more information. WHERE CAN I FIND MORE INFORMATION? National Heart, Lung, and Blood Institute: CablePromo.it

## 2015-10-17 ENCOUNTER — Other Ambulatory Visit: Payer: Self-pay | Admitting: Family Medicine

## 2015-10-17 DIAGNOSIS — M2550 Pain in unspecified joint: Secondary | ICD-10-CM

## 2015-12-06 ENCOUNTER — Other Ambulatory Visit: Payer: Self-pay | Admitting: Family Medicine

## 2016-01-04 ENCOUNTER — Other Ambulatory Visit: Payer: Self-pay | Admitting: Family Medicine

## 2016-03-19 ENCOUNTER — Other Ambulatory Visit: Payer: Self-pay | Admitting: *Deleted

## 2016-03-23 MED ORDER — METOPROLOL SUCCINATE ER 100 MG PO TB24: 100.0000 mg | ORAL_TABLET | Freq: Every day | ORAL | 0 refills | Status: DC

## 2016-03-23 NOTE — Telephone Encounter (Signed)
Second request

## 2016-04-15 ENCOUNTER — Other Ambulatory Visit: Payer: Self-pay | Admitting: *Deleted

## 2016-04-15 MED ORDER — BENAZEPRIL HCL 40 MG PO TABS: 40.0000 mg | ORAL_TABLET | Freq: Every day | ORAL | 0 refills | Status: DC

## 2016-06-30 ENCOUNTER — Other Ambulatory Visit: Payer: Self-pay | Admitting: Student in an Organized Health Care Education/Training Program

## 2016-07-19 ENCOUNTER — Other Ambulatory Visit: Payer: Self-pay | Admitting: Student in an Organized Health Care Education/Training Program

## 2016-08-20 ENCOUNTER — Other Ambulatory Visit: Payer: Self-pay | Admitting: *Deleted

## 2016-08-20 DIAGNOSIS — I10 Essential (primary) hypertension: Secondary | ICD-10-CM

## 2016-08-21 MED ORDER — AMLODIPINE BESYLATE 10 MG PO TABS: 10.0000 mg | ORAL_TABLET | Freq: Every day | ORAL | 4 refills | Status: DC

## 2016-10-26 ENCOUNTER — Other Ambulatory Visit: Payer: Self-pay | Admitting: *Deleted

## 2016-10-26 ENCOUNTER — Other Ambulatory Visit: Payer: Self-pay | Admitting: Student in an Organized Health Care Education/Training Program

## 2016-10-26 DIAGNOSIS — M2550 Pain in unspecified joint: Secondary | ICD-10-CM

## 2016-10-26 MED ORDER — MELOXICAM 7.5 MG PO TABS: 7.5000 mg | ORAL_TABLET | Freq: Every day | ORAL | 0 refills | Status: DC

## 2016-11-11 ENCOUNTER — Encounter: Payer: Medicare Other | Admitting: Student in an Organized Health Care Education/Training Program

## 2016-11-16 ENCOUNTER — Other Ambulatory Visit: Payer: Self-pay | Admitting: Student in an Organized Health Care Education/Training Program

## 2016-11-25 ENCOUNTER — Other Ambulatory Visit: Payer: Self-pay | Admitting: Student in an Organized Health Care Education/Training Program

## 2016-11-26 ENCOUNTER — Other Ambulatory Visit: Payer: Self-pay | Admitting: Student in an Organized Health Care Education/Training Program

## 2016-11-26 DIAGNOSIS — M2550 Pain in unspecified joint: Secondary | ICD-10-CM

## 2016-11-26 NOTE — Telephone Encounter (Signed)
I am refilling patient's benazepril for one month. Please call the patient and ask to schedule an appointment for him to be seen for labs.  Thank you,  Howard PouchLauren Serrina [place]

## 2016-11-26 NOTE — Telephone Encounter (Signed)
Tried to contact pt and phone only rang to give him the below information and to see about scheduling and appointment.  Looks like pt already has appointment scheduled for the end of the month so there is nothing left to do in this message. Lamonte SakaiZimmerman Rumple, April D, [place]

## 2016-12-05 ENCOUNTER — Other Ambulatory Visit: Payer: Self-pay | Admitting: Student in an Organized Health Care Education/Training Program

## 2016-12-05 DIAGNOSIS — M2550 Pain in unspecified joint: Secondary | ICD-10-CM

## 2016-12-09 ENCOUNTER — Encounter: Payer: Self-pay | Admitting: Student in an Organized Health Care Education/Training Program

## 2016-12-09 ENCOUNTER — Ambulatory Visit (INDEPENDENT_AMBULATORY_CARE_PROVIDER_SITE_OTHER): Payer: Medicare Other | Admitting: Student in an Organized Health Care Education/Training Program

## 2016-12-09 VITALS — BP 140/90 | HR 73 | Temp 99.0°F | Ht 60.0 in | Wt 191.2 lb

## 2016-12-09 DIAGNOSIS — Z114 Encounter for screening for human immunodeficiency virus [HIV]: Secondary | ICD-10-CM

## 2016-12-09 DIAGNOSIS — H578 Other specified disorders of eye and adnexa: Secondary | ICD-10-CM

## 2016-12-09 DIAGNOSIS — H5789 Other specified disorders of eye and adnexa: Secondary | ICD-10-CM

## 2016-12-09 DIAGNOSIS — Z791 Long term (current) use of non-steroidal anti-inflammatories (NSAID): Secondary | ICD-10-CM | POA: Diagnosis not present

## 2016-12-09 DIAGNOSIS — Z5181 Encounter for therapeutic drug level monitoring: Secondary | ICD-10-CM | POA: Diagnosis not present

## 2016-12-09 DIAGNOSIS — I10 Essential (primary) hypertension: Secondary | ICD-10-CM | POA: Diagnosis not present

## 2016-12-09 DIAGNOSIS — Z0000 Encounter for general adult medical examination without abnormal findings: Secondary | ICD-10-CM | POA: Diagnosis not present

## 2016-12-09 NOTE — Patient Instructions (Addendum)
It was a pleasure seeing you today in our clinic. Here is the treatment plan we have discussed and agreed upon together:  - we will do a trial off of the Mobic as this can be damaging to kidneys to take long term - we will test labs today - I will call with results of lab work, if you do not hear from me in 1 week please call our office - I would throw out the old bottle of Pataday as this may be causing red eyes if it is expired. Over the counter you can get Zaditor, which is a similar medication available at your pharmacy if Clarance has itchy eyes.  Our clinic's number is [phone]. Please call with questions or concerns about what we discussed today.  Be well, Dr. Mosetta PuttFeng

## 2016-12-09 NOTE — Assessment & Plan Note (Signed)
-   Chronic issue, worse in summer, likely allergic in nature though may be reaction to expired eye drops being used daily - Pataday was prescribed in 2014, patient's mother thinks it is the same bottle - Asked her to throw out this bottle - give patient a break from pataday as he has not been having itching or irritation of the eyes - if he develops eye itching can use OTC antihistamine eye drop - if symptoms worsen or fail to resolve recommend having him come back in to be seen

## 2016-12-09 NOTE — Progress Notes (Signed)
   CC: Follow up chronic issues  HPI: Glen Bauer is a 36 y.o. male with PMH significant for cerebral palsy with MR, Asthma, HTN, and obesity who [facility] follow up of his chronic medical conditions.  Patient is nonverbal and history was obtained from his mother (primary caretaker) who was present in the room.  HTN - patient does not check at home - no CP, palpitations or dyspnea (though patient would not be able to endorse due to nonverbal status) - takes benazepril, norvasc,metoprolol and HCTZ daily  Chronic mobic use - patient has not endorsed pain to his mother - this was originally for Left Knee pain - his mom states she hears "cracking" when he gets up or moves around so she thought he needed this med - she is agreeable to trial off of mobic given risks of kidney injury with long term use  Red eyes, chronic - noticed on and off, worse in the summer - not itchy - uses pataday once daily, patient's mother states this is from 2014 and was never refilled so old bottle - no drainage  - no changes in eye sight - has glasses but does not like to wear them  Review of Symptoms:  See HPI for ROS.   CC, SH/smoking status, and VS noted.  Objective: BP 140/90   Pulse 73   Temp 99 F (37.2 C) (Oral)   Ht 5' (1.524 m)   Wt 191 lb 3.2 oz (86.7 kg)   SpO2 94%   BMI 37.34 kg/m  GEN: NAD, alert, cooperative, and pleasant intellectually disabled male, nonverbal EYE: no conjunctival injection, pupils equally round and reactive to light ENMT: normal tympanic light reflex, no nasal polyps,no rhinorrhea, no pharyngeal erythema or exudates NECK: full ROM, no thyromegaly RESPIRATORY: clear to auscultation bilaterally with no wheezes, rhonchi or rales, good effort CV: RRR, no m/r/g, no peripheral edema GI: soft, non-tender, non-distended, normoactive bowel sounds, no hepatosplenomegaly SKIN: warm and dry, no rashes or lesions NEURO: II-XII grossly intact, normal gait,  peripheral sensation intact PSYCH: AAOx3, appropriate affect  Assessment and plan:  HYPERTENSION, BENIGN SYSTEMIC - BP at goal - continue current regimen - no red flag symptoms - given risk factors with obesity and HTN, will check lipid panel today - will check BMP for monitoring electrolytes and creatinine given patient is chronically on an ACE and HCTZ - if glucose is elevated on BMP will add on HbA1c  Redness of both eyes - Chronic issue, worse in summer, likely allergic in nature though may be reaction to expired eye drops being used daily - Pataday was prescribed in 2014, patient's mother thinks it is the same bottle - Asked her to throw out this bottle - give patient a break from pataday as he has not been having itching or irritation of the eyes - if he develops eye itching can use OTC antihistamine eye drop - if symptoms worsen or fail to resolve recommend having him come back in to be seen  Encounter for monitoring chronic NSAID therapy Patient has been on mobic chronically and he gets it standing every day rather than PRN. - will stop mobic - patient has not been complaining of pain - check BMP   Orders Placed This Encounter  Procedures  . Basic metabolic panel  . Lipid panel  . HIV antibody    Howard PouchLauren Adreanna Fickel, MD,MS,  PGY1 12/09/2016 5:32 PM

## 2016-12-09 NOTE — Assessment & Plan Note (Signed)
-   BP at goal - continue current regimen - no red flag symptoms - given risk factors with obesity and HTN, will check lipid panel today - will check BMP for monitoring electrolytes and creatinine given patient is chronically on an ACE and HCTZ - if glucose is elevated on BMP will add on HbA1c

## 2016-12-09 NOTE — Assessment & Plan Note (Signed)
Patient has been on mobic chronically and he gets it standing every day rather than PRN. - will stop mobic - patient has not been complaining of pain - check BMP

## 2016-12-10 LAB — HIV ANTIBODY (ROUTINE TESTING W REFLEX): HIV SCREEN 4TH GENERATION: NONREACTIVE

## 2016-12-10 LAB — BASIC METABOLIC PANEL
BUN / CREAT RATIO: 11 (ref 9–20)
BUN: 8 mg/dL (ref 6–20)
CO2: 25 mmol/L (ref 18–29)
CREATININE: 0.74 mg/dL — AB (ref 0.76–1.27)
Calcium: 10 mg/dL (ref 8.7–10.2)
Chloride: 98 mmol/L (ref 96–106)
GFR calc Af Amer: 139 mL/min/{1.73_m2} (ref >59)
GFR calc non Af Amer: 120 mL/min/{1.73_m2} (ref >59)
GLUCOSE: 85 mg/dL (ref 65–99)
Potassium: 3.9 mmol/L (ref 3.5–5.2)
SODIUM: 142 mmol/L (ref 134–144)

## 2016-12-10 LAB — LIPID PANEL
CHOL/HDL RATIO: 3.3 ratio (ref 0.0–5.0)
CHOLESTEROL TOTAL: 175 mg/dL (ref 100–199)
HDL: 53 mg/dL (ref >39)
LDL CALC: 106 mg/dL — AB (ref 0–99)
Triglycerides: 80 mg/dL (ref 0–149)
VLDL Cholesterol Cal: 16 mg/dL (ref 5–40)

## 2016-12-14 ENCOUNTER — Encounter: Payer: Self-pay | Admitting: Student in an Organized Health Care Education/Training Program

## 2017-01-12 ENCOUNTER — Other Ambulatory Visit: Payer: Self-pay | Admitting: Student in an Organized Health Care Education/Training Program

## 2017-01-12 MED ORDER — HYDROCHLOROTHIAZIDE 25 MG PO TABS: 25.0000 mg | ORAL_TABLET | Freq: Every day | ORAL | 3 refills | Status: DC

## 2017-03-31 ENCOUNTER — Ambulatory Visit (INDEPENDENT_AMBULATORY_CARE_PROVIDER_SITE_OTHER): Payer: Medicare Other | Admitting: Student in an Organized Health Care Education/Training Program

## 2017-03-31 ENCOUNTER — Encounter: Payer: Self-pay | Admitting: Student in an Organized Health Care Education/Training Program

## 2017-03-31 VITALS — BP 122/76 | HR 74 | Temp 99.2°F | Ht 60.0 in | Wt 188.8 lb

## 2017-03-31 DIAGNOSIS — R630 Anorexia: Secondary | ICD-10-CM | POA: Diagnosis present

## 2017-03-31 NOTE — Progress Notes (Signed)
   CC: poor appetite resolved  HPI: Glen Bauer is a 36 y.o. male with PMH significant for developmental delay who presents to [facility] today with a history of poor appetite two weeks ago.  Poor appetite, temporary, resolved - Patient was in his usual state of health when two weeks ago mom noticed that he had poor appetite. He did not eat everything offered to him, when he usually does. Because Glen Bauer is nonverbal, mom takes behavioral changes like this very seriously. She continued to monitor his PO intake and noted that he went back to eating like his normal self shortly after that. He has not had fevers, no N/V/D/C, no changes in urination. He has a bowel movement daily, and they are normal. He has not been complaining of abdominal pain. Since that episode two weeks ago he has been eating normally.  On further questioning, mom reports that Glen Bauer's dad visited someone in the hospital who was found to have MRSA. She is very anxious about MRSA because she has heard before that it can be very serious and she is worried about Glen Bauer's potential exposure to it.   Review of Symptoms:  See HPI for ROS.   CC, SH/smoking status, and VS noted.  Objective: BP 122/76   Pulse 74   Temp 99.2 F (37.3 C) (Oral)   Ht 5' (1.524 m)   Wt 85.6 kg (188 lb 12.8 oz)   SpO2 97%   BMI 36.87 kg/m  GEN: Developmentally and mentally delayed, NAD, nontoxic, pleasant, interacts with examiner and asks to play EYE: no conjunctival injection, pupils equally round and reactive to light ENMT: normal tympanic light reflex, no nasal polyps,no rhinorrhea, no pharyngeal erythema or exudates NECK: full ROM, no thyromegaly RESPIRATORY: clear to auscultation bilaterally with no wheezes, rhonchi or rales, good effort CV: RRR, no m/r/g, no peripheral edema GI: soft, non-tender, non-distended SKIN: warm and dry, no rashes or lesions  Assessment and plan:  Decrease in appetite Could have been caused by a myriad of  different etiologies, but it was two weeks ago. Patient's weight is stable and by history his appetite is fine now.  - Mom is concerned about MRSA because of a family member in the hospital, counseled about this [place] may have MRSA on his skin etc, but he is not sick right now) - return precautions provided - follow up as needed  Everrett Coombe, MD,MS,  PGY2 04/04/2017 6:25 PM

## 2017-04-04 DIAGNOSIS — R630 Anorexia: Secondary | ICD-10-CM | POA: Insufficient documentation

## 2017-04-04 NOTE — Assessment & Plan Note (Signed)
Could have been caused by a myriad of different etiologies, but it was two weeks ago. Patient's weight is stable and by history his appetite is fine now.  - Mom is concerned about MRSA because of a family member in the hospital, counseled about this [place] may have MRSA on his skin etc, but he is not sick right now) - return precautions provided - follow up as needed

## 2017-04-04 NOTE — Patient Instructions (Signed)
Our clinic's number is [phone]. Please call with questions or concerns about what we discussed today.  Be well, Dr. Blanch Stang   

## 2017-05-03 ENCOUNTER — Other Ambulatory Visit: Payer: Self-pay | Admitting: Student in an Organized Health Care Education/Training Program

## 2017-09-14 ENCOUNTER — Other Ambulatory Visit: Payer: Self-pay | Admitting: Student in an Organized Health Care Education/Training Program

## 2017-09-24 ENCOUNTER — Encounter: Payer: Self-pay | Admitting: Student in an Organized Health Care Education/Training Program

## 2017-09-24 ENCOUNTER — Ambulatory Visit (INDEPENDENT_AMBULATORY_CARE_PROVIDER_SITE_OTHER): Payer: Medicare Other | Admitting: Student in an Organized Health Care Education/Training Program

## 2017-09-24 ENCOUNTER — Other Ambulatory Visit: Payer: Self-pay

## 2017-09-24 DIAGNOSIS — Z0000 Encounter for general adult medical examination without abnormal findings: Secondary | ICD-10-CM | POA: Diagnosis present

## 2017-09-24 NOTE — Assessment & Plan Note (Addendum)
Patient appears at his baseline on exam. Follow up in 1 year or sooner as needed. Letter provided at mother's request for the [employer] stating that patient is severely disabled and dependent on his elderly mother for all ADLs. Offered referral for home health services (patient should qualify for home health aid) and his mother will consider, advised her to reach out if she needs this assistance in the future.

## 2017-09-24 NOTE — Patient Instructions (Signed)
It was a pleasure seeing you today in our clinic.   Our clinic's number is [phone]. Please call with questions or concerns about what we discussed today.  Be well, Dr. Midori Dado   

## 2017-09-24 NOTE — Progress Notes (Signed)
   CC: well adult exam  HPI: Glen Bauer is a 37 y.o. male with PMH significant for severe mental disability secondary to cerebral palsy who presents to [facility] today for a check up.  He has no complaints. His mother speaks on his behalf because he is nonverbal. He has been acting himself. No N/V/D/C. No urinary changes.  Review of Symptoms:  See HPI for ROS.   CC, SH/smoking status, and VS noted.  Objective: BP 116/80   Pulse 64   Temp 99 F (37.2 C) (Oral)   Ht 5' (1.524 m)   Wt 182 lb 12.8 oz (82.9 kg)   SpO2 98%   BMI 35.70 kg/m  GEN: mentally disabled male, NAD EYE: no conjunctival injection, pupils equally round and reactive to light RESPIRATORY: clear to auscultation bilaterally with no wheezes, rhonchi or rales, good effort CV: RRR, no m/r/g, no peripheral edema GI: soft, non-tender, non-distended, no hepatosplenomegaly SKIN: warm and dry, no rashes or lesions NEURO: II-XII grossly intact PSYCH: AAOx3, appropriate affect  Assessment and plan:  Well adult exam Patient appears at his baseline on exam. Follow up in 1 year or sooner as needed. Letter provided at mother's request for the IRS stating that patient is severely disabled and dependent on his elderly mother for all ADLs. Offered referral for home health services (patient should qualify for home health aid) and his mother will consider, advised her to reach out if she needs this assistance in the future.    Howard PouchLauren Anouk Critzer, MD,MS,  PGY2 09/24/2017 4:09 PM

## 2017-11-25 ENCOUNTER — Other Ambulatory Visit: Payer: Self-pay | Admitting: Student in an Organized Health Care Education/Training Program

## 2017-11-25 DIAGNOSIS — I10 Essential (primary) hypertension: Secondary | ICD-10-CM

## 2017-12-22 ENCOUNTER — Other Ambulatory Visit: Payer: Self-pay | Admitting: Student in an Organized Health Care Education/Training Program

## 2018-02-10 ENCOUNTER — Other Ambulatory Visit: Payer: Self-pay | Admitting: Student in an Organized Health Care Education/Training Program

## 2018-03-25 ENCOUNTER — Other Ambulatory Visit: Payer: Self-pay | Admitting: Student in an Organized Health Care Education/Training Program

## 2018-03-25 DIAGNOSIS — I10 Essential (primary) hypertension: Secondary | ICD-10-CM

## 2018-04-28 ENCOUNTER — Other Ambulatory Visit: Payer: Self-pay | Admitting: Student in an Organized Health Care Education/Training Program

## 2018-04-29 ENCOUNTER — Other Ambulatory Visit: Payer: Self-pay

## 2018-04-30 MED ORDER — BENAZEPRIL HCL 40 MG PO TABS: ORAL_TABLET | ORAL | 0 refills | Status: DC

## 2018-06-07 ENCOUNTER — Other Ambulatory Visit: Payer: Self-pay | Admitting: Student in an Organized Health Care Education/Training Program

## 2018-06-08 NOTE — Telephone Encounter (Signed)
Covering for Dr. Mosetta PuttFeng, will refill metoprolol.

## 2018-08-07 ENCOUNTER — Other Ambulatory Visit: Payer: Self-pay | Admitting: Student in an Organized Health Care Education/Training Program

## 2018-08-08 ENCOUNTER — Telehealth: Payer: Self-pay | Admitting: *Deleted

## 2018-08-08 NOTE — Telephone Encounter (Addendum)
Contacted pt number and a male answered the phone and stated that pt mom was not home.  Informed him that we would try and call back.  If pt mom were to call back please inform her of the above message and assist in getting pt an appointment. Lamonte Sakai, Sumayya Muha D, [place]

## 2018-08-08 NOTE — Telephone Encounter (Signed)
Glen Bauer, Lauren, MD 6 hours ago (10:00 AM)      Refilled. Patient should come in for an OV for monitoring blood work and BP check.       Documentation

## 2018-08-08 NOTE — Telephone Encounter (Signed)
Refilled. Patient should come in for an OV for monitoring blood work and BP check.

## 2018-08-09 NOTE — Telephone Encounter (Signed)
Pt mom informed.  Appt made for 09/19/18. Fleeger, Maryjo Rochester, CMA

## 2018-09-19 ENCOUNTER — Ambulatory Visit: Payer: Medicare Other | Admitting: Student in an Organized Health Care Education/Training Program

## 2018-09-28 ENCOUNTER — Other Ambulatory Visit: Payer: Self-pay

## 2018-09-29 MED ORDER — METOPROLOL SUCCINATE ER 100 MG PO TB24: ORAL_TABLET | ORAL | 0 refills | Status: DC

## 2018-10-03 ENCOUNTER — Other Ambulatory Visit: Payer: Self-pay

## 2018-10-03 MED ORDER — METOPROLOL SUCCINATE ER 100 MG PO TB24: ORAL_TABLET | ORAL | 0 refills | Status: DC

## 2018-11-05 ENCOUNTER — Other Ambulatory Visit: Payer: Self-pay | Admitting: Student in an Organized Health Care Education/Training Program

## 2018-11-09 ENCOUNTER — Other Ambulatory Visit: Payer: Self-pay | Admitting: *Deleted

## 2018-11-10 MED ORDER — HYDROCHLOROTHIAZIDE 25 MG PO TABS: ORAL_TABLET | ORAL | 0 refills | Status: DC

## 2018-11-18 ENCOUNTER — Other Ambulatory Visit: Payer: Self-pay | Admitting: Student in an Organized Health Care Education/Training Program

## 2018-11-29 ENCOUNTER — Ambulatory Visit: Payer: Medicare Other | Admitting: Student in an Organized Health Care Education/Training Program

## 2019-02-23 ENCOUNTER — Other Ambulatory Visit: Payer: Self-pay | Admitting: Student in an Organized Health Care Education/Training Program

## 2019-02-23 NOTE — Telephone Encounter (Signed)
Patient needs to schedule appt for further refills. Has not been seen since march 2019.

## 2019-03-04 DIAGNOSIS — L739 Follicular disorder, unspecified: Secondary | ICD-10-CM | POA: Diagnosis not present

## 2019-03-04 DIAGNOSIS — L03811 Cellulitis of head [any part, except face]: Secondary | ICD-10-CM | POA: Diagnosis not present

## 2019-03-31 ENCOUNTER — Encounter: Payer: Self-pay | Admitting: Family Medicine

## 2019-03-31 ENCOUNTER — Other Ambulatory Visit: Payer: Self-pay

## 2019-03-31 MED ORDER — BENAZEPRIL HCL 40 MG PO TABS: ORAL_TABLET | ORAL | 0 refills | Status: DC

## 2019-04-03 ENCOUNTER — Other Ambulatory Visit: Payer: Self-pay

## 2019-04-03 MED ORDER — BENAZEPRIL HCL 40 MG PO TABS: ORAL_TABLET | ORAL | 0 refills | Status: DC

## 2019-04-03 NOTE — Telephone Encounter (Signed)
Patient is requesting a 90 day supply

## 2019-04-24 ENCOUNTER — Encounter: Payer: Self-pay | Admitting: Family Medicine

## 2019-04-24 ENCOUNTER — Telehealth (INDEPENDENT_AMBULATORY_CARE_PROVIDER_SITE_OTHER): Payer: Medicare Other | Admitting: Family Medicine

## 2019-04-24 ENCOUNTER — Other Ambulatory Visit: Payer: Self-pay

## 2019-04-24 DIAGNOSIS — I10 Essential (primary) hypertension: Secondary | ICD-10-CM | POA: Diagnosis not present

## 2019-04-24 NOTE — Assessment & Plan Note (Signed)
Patient has not been seen since 2019.  He is on 4 blood pressure medications right now as per chart.  His last blood pressure was taken at an ED visit and was 171/125.  I would like to see him in the office to clarify his medications, as his mom cannot name them over the phone.  Additionally, asked mom to measure blood pressures daily every morning until he is seen in the office. Future Appointments  Date Time Provider [place]  05/08/2019  3:35 PM Wilber Oliphant, MD [facility]

## 2019-04-24 NOTE — Progress Notes (Signed)
[place] Telemedicine Visit  Patient consented to have virtual visit. Method of visit: Telephone  Encounter participants: Patient: Glen Bauer (not on phone call) Provider: Wilber Oliphant - located at [facility] Others (if applicable): Mother located at home  Chief Complaint: Medication Refill    HPI: Patient's mom is calling in today for telephone visit to refill patient's medications.  Patient's mom does not know what medication this patient takes.  She reports that he takes 4 medications but mom does not know which.  Patient's last clinic visit was in March 2019 with Dr. Burr Medico.  He had another appointment in March of this year but was a no-show.  Mom reports that patient and her have been isolating at home and have not left the house except for one time to go to the emergency department.  Patient's mom reports that patient's last blood pressure was taken at his last visit to the ED.  At that visit, patient's blood pressure was 171/125.  Mom reports that patient has not been to the ophthalmologist.  ROS: per HPI  Pertinent PMHx: Cerebral palsy, obesity, hypertension  Exam:  Not applicable  Assessment/Plan:  HYPERTENSION, BENIGN SYSTEMIC Patient has not been seen since 2019.  He is on 4 blood pressure medications right now as per chart.  His last blood pressure was taken at an ED visit and was 171/125.  I would like to see him in the office to clarify his medications, as his mom cannot name them over the phone.  Additionally, asked mom to measure blood pressures daily every morning until he is seen in the office. Future Appointments  Date Time Provider [place]  05/08/2019  3:35 PM Wilber Oliphant, MD [facility] [place]      Time spent during visit with patient: 10 minutes  Wilber Oliphant, MD

## 2019-05-01 ENCOUNTER — Other Ambulatory Visit: Payer: Self-pay | Admitting: Student in an Organized Health Care Education/Training Program

## 2019-05-01 ENCOUNTER — Ambulatory Visit: Payer: Medicare Other | Admitting: Family Medicine

## 2019-05-01 DIAGNOSIS — I10 Essential (primary) hypertension: Secondary | ICD-10-CM

## 2019-05-02 NOTE — Telephone Encounter (Signed)
Will send one refill. Patient to follow up in a couple weeks with PCP

## 2019-05-08 ENCOUNTER — Ambulatory Visit: Payer: Medicare Other | Admitting: Family Medicine

## 2019-05-12 ENCOUNTER — Other Ambulatory Visit: Payer: Self-pay | Admitting: Student in an Organized Health Care Education/Training Program

## 2019-05-12 NOTE — Telephone Encounter (Signed)
Needs appointment

## 2019-05-24 NOTE — Telephone Encounter (Addendum)
Pt has appointment on 06/13/2019 @ 11:00am with PCP.Lisvet Rasheed Zimmerman Rumple, CMA

## 2019-06-13 ENCOUNTER — Other Ambulatory Visit: Payer: Self-pay

## 2019-06-13 ENCOUNTER — Ambulatory Visit (INDEPENDENT_AMBULATORY_CARE_PROVIDER_SITE_OTHER): Payer: Medicare Other | Admitting: Family Medicine

## 2019-06-13 ENCOUNTER — Encounter: Payer: Self-pay | Admitting: Family Medicine

## 2019-06-13 VITALS — BP 112/71 | HR 76 | Temp 98.6°F | Wt 196.0 lb

## 2019-06-13 DIAGNOSIS — E669 Obesity, unspecified: Secondary | ICD-10-CM | POA: Diagnosis not present

## 2019-06-13 DIAGNOSIS — I10 Essential (primary) hypertension: Secondary | ICD-10-CM

## 2019-06-13 DIAGNOSIS — Z23 Encounter for immunization: Secondary | ICD-10-CM | POA: Diagnosis not present

## 2019-06-13 DIAGNOSIS — F8189 Other developmental disorders of scholastic skills: Secondary | ICD-10-CM | POA: Diagnosis not present

## 2019-06-13 DIAGNOSIS — J4520 Mild intermittent asthma, uncomplicated: Secondary | ICD-10-CM

## 2019-06-13 NOTE — Patient Instructions (Signed)
Dear Lorna Few,   It was good to see you! Thank you for taking your time to come in to be seen. Today, we discussed the following:    High Blood pressure   Please call us when you get home to tell us what his blood pressures have been like at home.   Thank you for getting your flu shot today    Be well,   Zettie Cooley, M.D   [place] [phone]  *Sign up for MyChart for instant access to your health profile, labs, orders, upcoming appointments or to contact your provider with questions*  ===================================================================================

## 2019-06-13 NOTE — Progress Notes (Signed)
   Subjective  CC: HTN follow up [id number] N Corradi is a 38 y.o. male who presents today with the following problems:  Hypertension: Patient here for follow-up of elevated blood pressure. He is exercising and is not adherent to low salt diet.  Blood pressure is well controlled at home. Cardiac symptoms none. Patient denies chest pain, chest pressure/discomfort, fatigue and syncope.  Cardiovascular risk factors: male gender, obesity (BMI >= 30 kg/m2) and sedentary lifestyle. Use of agents associated with hypertension: none. History of target organ damage: none. Mom provides all history as patient is non-verbal Mom has list of blood pressures, but she forgot to bring it in. She doesn't know what numbers he is around.   Pertinent PM/FHx: CP  Social Hx: Never smoker. Denies ETOH & drug use.  ROS: Pertinent ROS included in HPI. Objective  Physical Exam:  BP 112/71   Pulse 76   Temp 98.6 F (37 C) (Oral)   Wt 196 lb (88.9 kg)   SpO2 96%   BMI 38.28 kg/m  General: well appearing, obese black male,  Sitting quietly in chair drawing on magazines. Nonverbal. Makes good eye contact. Strabismus. Interacts with me sparingly. Does follow commands.Back: kyphosis of upper thoracic spine.  Heart: RRR. No murmurs appreciated. No BLEE Lungs: CTAB, no respiratory distress.   Assessment & Plan    Problem List Items Addressed This Visit    HYPERTENSION, BENIGN SYSTEMIC - Primary (Chronic)    BP 112/71 today. Mom wrote down values of patient's BP at home, but forgot to bring it with her today.  Patient takes for antihypertensives: Amlodipine 10 mg, Lotensin 40 mg, hydrochlorothiazide 25 mg, metoprolol 100 mg XL.  Can consider decreasing 1 of these medications.  Asked mom to take blood pressures at home and we will follow-up at a visit next week.  We will also follow-up with lab results at that time.  Obtaining CMP, CBC, lipid panel.       Relevant Orders   Lipid Panel (Completed)   CBC (Completed)   cmp (Completed)   Obesity (Chronic)    Encouraged increased exercise with 60 minutes of cardiovascular activity once daily for at least 5 times a week.  Also encouraged low-salt diet, especially in the setting of hypertension.  With lifestyle modifications, patient could come off of one of his medications.      Well controlled intermittent asthma    Asthma is currently well controlled.  Patient has not had an exacerbation in 2 years.  Usually associated with URIs.        Other Visit Diagnoses    Need for immunization against influenza       Relevant Orders   Flu Vaccine QUAD 36+ mos IM (Completed)     Wilber Oliphant, M.D.  8:48 AM 06/16/2019

## 2019-06-14 LAB — LIPID PANEL
Chol/HDL Ratio: 3.4 ratio (ref 0.0–5.0)
Cholesterol, Total: 172 mg/dL (ref 100–199)
HDL: 50 mg/dL (ref >39)
LDL Chol Calc (NIH): 109 mg/dL — ABNORMAL HIGH (ref 0–99)
Triglycerides: 68 mg/dL (ref 0–149)
VLDL Cholesterol Cal: 13 mg/dL (ref 5–40)

## 2019-06-14 LAB — COMPREHENSIVE METABOLIC PANEL
ALT: 14 IU/L (ref 0–44)
AST: 19 IU/L (ref 0–40)
Albumin/Globulin Ratio: 1.1 — ABNORMAL LOW (ref 1.2–2.2)
Albumin: 4.4 g/dL (ref 4.0–5.0)
Alkaline Phosphatase: 138 IU/L — ABNORMAL HIGH (ref 39–117)
BUN/Creatinine Ratio: 6 — ABNORMAL LOW (ref 9–20)
BUN: 6 mg/dL (ref 6–20)
Bilirubin Total: 0.3 mg/dL (ref 0.0–1.2)
CO2: 19 mmol/L — ABNORMAL LOW (ref 20–29)
Calcium: 9.4 mg/dL (ref 8.7–10.2)
Chloride: 102 mmol/L (ref 96–106)
Creatinine, Ser: 0.99 mg/dL (ref 0.76–1.27)
GFR calc Af Amer: 112 mL/min/{1.73_m2} (ref >59)
GFR calc non Af Amer: 97 mL/min/{1.73_m2} (ref >59)
Globulin, Total: 3.9 g/dL (ref 1.5–4.5)
Glucose: 100 mg/dL — ABNORMAL HIGH (ref 65–99)
Potassium: 4.1 mmol/L (ref 3.5–5.2)
Sodium: 138 mmol/L (ref 134–144)
Total Protein: 8.3 g/dL (ref 6.0–8.5)

## 2019-06-14 LAB — CBC
Hematocrit: 50.5 % (ref 37.5–51.0)
Hemoglobin: 16.9 g/dL (ref 13.0–17.7)
MCH: 28.6 pg (ref 26.6–33.0)
MCHC: 33.5 g/dL (ref 31.5–35.7)
MCV: 85 fL (ref 79–97)
Platelets: 238 10*3/uL (ref 150–450)
RBC: 5.91 x10E6/uL — ABNORMAL HIGH (ref 4.14–5.80)
RDW: 13.9 % (ref 11.6–15.4)
WBC: 3.8 10*3/uL (ref 3.4–10.8)

## 2019-06-16 ENCOUNTER — Encounter: Payer: Self-pay | Admitting: Family Medicine

## 2019-06-16 NOTE — Assessment & Plan Note (Signed)
Asthma is currently well controlled.  Patient has not had an exacerbation in 2 years.  Usually associated with URIs.

## 2019-06-16 NOTE — Assessment & Plan Note (Signed)
Encouraged increased exercise with 60 minutes of cardiovascular activity once daily for at least 5 times a week.  Also encouraged low-salt diet, especially in the setting of hypertension.  With lifestyle modifications, patient could come off of one of his medications.

## 2019-06-16 NOTE — Assessment & Plan Note (Deleted)
Asthma is currently well controlled.  Patient has not had an exacerbation in 2 years.  Usually associated with URIs.  

## 2019-06-16 NOTE — Assessment & Plan Note (Addendum)
BP 112/71 today. Mom wrote down values of patient's BP at home, but forgot to bring it with her today.  Patient takes for antihypertensives: Amlodipine 10 mg, Lotensin 40 mg, hydrochlorothiazide 25 mg, metoprolol 100 mg XL.  Can consider decreasing 1 of these medications.  Asked mom to take blood pressures at home and we will follow-up at a visit next week.  We will also follow-up with lab results at that time.  Obtaining CMP, CBC, lipid panel.

## 2019-06-22 ENCOUNTER — Telehealth: Payer: Medicare Other | Admitting: Family Medicine

## 2019-06-22 ENCOUNTER — Other Ambulatory Visit: Payer: Self-pay

## 2019-07-31 ENCOUNTER — Telehealth: Payer: Self-pay | Admitting: *Deleted

## 2019-07-31 DIAGNOSIS — I10 Essential (primary) hypertension: Secondary | ICD-10-CM

## 2019-08-02 NOTE — Telephone Encounter (Signed)
2nd request. Navya Timmons Zimmerman Rumple, CMA  

## 2019-08-03 ENCOUNTER — Encounter: Payer: Self-pay | Admitting: *Deleted

## 2019-08-03 MED ORDER — AMLODIPINE BESYLATE 10 MG PO TABS: 10.0000 mg | ORAL_TABLET | Freq: Every day | ORAL | 0 refills | Status: DC

## 2019-08-03 MED ORDER — BLOOD PRESSURE CUFF MISC: 1.0000 [IU] | Freq: Every day | 0 refills | Status: AC

## 2019-08-03 NOTE — Telephone Encounter (Signed)
Please call patient and schedule chronic disease follow up appointment for hypertension. Mom was not sure of what exact medications patient was taking at last visit. Please schedule virtual visit for both Glen Bauer and his mother, Glen Bauer. I am also placing an order for BP cuff so that they can monitor blood pressures at home.

## 2019-08-03 NOTE — Addendum Note (Signed)
Addended by: Genia Hotter E on: 08/03/2019 11:44 AM   Modules accepted: Orders

## 2019-08-08 ENCOUNTER — Ambulatory Visit: Payer: Self-pay | Admitting: Licensed Clinical Social Worker

## 2019-08-08 NOTE — Chronic Care Management (AMB) (Signed)
   Social Work  Care Management Consultation  08/08/2019 Name: RYDELL WIEGEL MRN: [id number] DOB: [birth year]  KIMANI HOVIS is a 39 y.o. year old male who sees Selena Batten, Vinnie Langton, MD for primary care. LCSW was consulted by PCP to assistance with getting patient to an appointment to managing chronic conditions. Patient was not interviewed or contacted during this encounter however LCSW reviewed chart, notes, insurance and collaboration with [facility] from office .     Recommendation: After reviewing chart LCSW determined that patient may benefit from [facility] RN care management.  PCP may consider mailing patient a letter to contact the office and schedule an appointment.   Intervention: LCSW contacted [facility] front office Staff to call patient to schedule appointment with PCP. Per Front office patient hung up on her when she called.   Review of patient status, including review of consultants reports, relevant laboratory and other test results, and collaboration with appropriate care team members and the patient's provider was performed as part of comprehensive patient evaluation and provision of chronic care management services.    Plan:  1.No further follow up required by LCSW at this time. 2. Referral given to CCM RN care manager  Sammuel Hines, LCSW Clinical Social Worker [facility]   [phone] 10:51 AM

## 2019-08-09 NOTE — Telephone Encounter (Signed)
Tried to contact pt mom and inform her of below and assist in getting an appointment scheduled. Phone only rang with no option to LVM.  If she calls back please help her get this scheduled. Magdalyn Arenivas Zimmerman Rumple, CMA

## 2019-08-10 ENCOUNTER — Ambulatory Visit: Payer: Self-pay

## 2019-08-10 NOTE — Chronic Care Management (AMB) (Signed)
Care Management   Initial Visit Note  08/10/2019 Name: Glen Bauer MRN: [id number] DOB: 10/05/1980   Assessment: JHACE FENNELL is a 39 y.o. year old male who sees Selena Batten, Vinnie Langton, MD for primary care. The care management team was consulted for assistance with care management and care coordination needs related to Disease Management  Educational Needs  HTN /Obesity.   Review of patient status, including review of consultants reports, relevant laboratory and other test results, and collaboration with appropriate care team members and the patient's provider was performed as part of comprehensive patient evaluation and provision of care management services.    SDOH (Social Determinants of Health) screening performed today: None. See Care Plan for related entries.    Outpatient Encounter Medications as of 08/10/2019  Medication Sig  . amLODipine (NORVASC) 10 MG tablet Take 1 tablet (10 mg total) by mouth daily.  . benazepril (LOTENSIN) 40 MG tablet Take one tablet PO daily  . Blood Pressure Monitoring (BLOOD PRESSURE CUFF) MISC 1 Units by Does not apply route daily.  . hydrochlorothiazide (HYDRODIURIL) 25 MG tablet TAKE 1 TABLET(25 MG) BY MOUTH DAILY  . metoprolol succinate (TOPROL-XL) 100 MG 24 hr tablet TAKE 1 TABLET BY MOUTH DAILY WITH A MEAL  . albuterol (PROVENTIL) (2.5 MG/3ML) 0.083% nebulizer solution Use 20mL q4h for cough/wheeze/SOB. Include all nebulizer machine and supplies. (Patient not taking: Reported on 08/10/2019)  . Olopatadine HCl (PATADAY) 0.2 % SOLN One drop in each eye daily. (Patient not taking: Reported on 08/10/2019)   No facility-administered encounter medications on file as of 08/10/2019.    Goals Addressed            This Visit's Progress   . Needs to keep a check on his blood pressure (pt-stated)       Current Barriers:  [name] Knowledge Deficits related to basic understanding of hypertension  self care management  Case Manager Clinical Goal(s):  [name] Over the  next 30 days, patient/mother will verbalize understanding of plan for hypertension management  Interventions:  . Evaluation of current treatment plan related to hypertension self management and patient's adherence to plan as established by provider. . Reviewed medications with patient and discussed importance of compliance . Discussed plans with patient for ongoing care management follow up and provided patient with direct contact information for care management team . Advised patient, providing education and rationale, to monitor blood pressure daily and record, calling PCP for findings outside established parameters.  . Follow low sodium diet  Patient Self Care Activities:  . Patient takes medications daily . Patient is unable to self manage chronic condition  Initial goal documentation          Follow up plan:  The care management team will reach out to the patient again over the next 14 days.  The patient has been provided with contact information for the care management team and has been advised to call with any health related questions or concerns.   Mr. Bloodworth was given information about Care Management services today including:  1. Care Management services include personalized support from designated clinical staff supervised by a physician, including individualized plan of care and coordination with other care providers 2. 24/7 contact phone numbers for assistance for urgent and routine care needs. 3. The patient may stop Care Management services at any time (effective at the end of the month) by phone call to the office staff.  Patient agreed to services and verbal consent obtained.  [name] RN,  BSN, [facility] Care Management Coordinator [place] Phone: [phone] Fax: [phone]

## 2019-08-10 NOTE — Patient Instructions (Signed)
Visit Information  Goals Addressed            This Visit's Progress   . Needs to keep a check on his blood pressure (pt-stated)       Current Barriers:  [name] Knowledge Deficits related to basic understanding of hypertension  self care management  Case Manager Clinical Goal(s):  [name] Over the next 30 days, patient/mother will verbalize understanding of plan for hypertension management  Interventions:  . Evaluation of current treatment plan related to hypertension self management and patient's adherence to plan as established by provider. . Reviewed medications with patient and discussed importance of compliance . Discussed plans with patient for ongoing care management follow up and provided patient with direct contact information for care management team . Advised patient, providing education and rationale, to monitor blood pressure daily and record, calling PCP for findings outside established parameters.  . Follow low sodium diet  Patient Self Care Activities:  . Patient takes medications daily . Patient is unable to self manage chronic condition  Initial goal documentation         Glen Bauer was given information about Care Management services today including:  1. Care Management services include personalized support from designated clinical staff supervised by his physician, including individualized plan of care and coordination with other care providers 2. 24/7 contact phone numbers for assistance for urgent and routine care needs. 3. The patient may stop CCM services at any time (effective at the end of the month) by phone call to the office staff.  Patient agreed to services and verbal consent obtained.   The patient verbalized understanding of instructions provided today and declined a print copy of patient instruction materials.   The care management team will reach out to the patient again over the next 14 days.  The patient has been provided with contact information for  the care management team and has been advised to call with any health related questions or concerns.   [name] RN, BSN, [facility] Care Management Coordinator [facility] Phone: [phone] Fax: [phone]

## 2019-08-18 NOTE — Telephone Encounter (Signed)
Received second Rx refill request for metoprolol. Called number in chart for pt. No answer no VM to leave a message. If pt calls, Please have him schedule an appt to see Dr. Selena Batten. Sunday Spillers, CMA

## 2019-08-22 ENCOUNTER — Other Ambulatory Visit: Payer: Self-pay

## 2019-08-22 ENCOUNTER — Ambulatory Visit: Payer: Medicare Other

## 2019-08-22 NOTE — Patient Instructions (Signed)
Visit Information  Goals Addressed            This Visit's Progress   . Needs to keep a check on his blood pressure (pt-stated)       Current Barriers:  [name] Knowledge Deficits related to basic understanding of hypertension  self care management  Case Manager Clinical Goal(s):  [name] Over the next 30 days, patient/mother will verbalize understanding of plan for hypertension management  Interventions:  . Evaluation of current treatment plan related to hypertension self management and patient's adherence to plan as established by provider. . Reviewed medications with patient and discussed importance of compliance . Discussed plans with patient for ongoing care management follow up and provided patient with direct contact information for care management team . Advised patient, providing education and rationale, to monitor blood pressure daily and record, calling PCP for findings outside established parameters.  . Follow low sodium diet  . 08/21/19 . Spoke with Mrs. Kalisz and she has been recording blood pressures .      1/31  167/120 HR  71 2/2   172/111 HR  70 2/3   162/99   HR  67 2/4   145/97   HR  73 2/5   170/108 HR  64 2/6   151/116 HR  68 . She states that he takes his medications daily . He does not have a lot of sodium in his diet . She does states that he does eat  a lot= discussed cutting down on his food intake . Discussed him doing exercise in the home being more active.  She states that he like to dance, or walk more in the home nothing strenuous. . She states that he drinks water. He drinks a lot of water, he wets the bed at night.  I suggested maybe cutting his last drinking time at 6 pm to see if that could help  . Call care management team with any questions or concerns . Will send education information "A matter of choice blood pressure control"    Patient Self Care Activities:  . Patient takes medications daily . Patient is unable to self manage chronic  condition  Please see past updates related to this goal by clicking on the "Past Updates" button in the selected goal          Mr. Roycroft was given information about Care Management services today including:  1. Care Management services include personalized support from designated clinical staff supervised by his physician, including individualized plan of care and coordination with other care providers 2. 24/7 contact phone numbers for assistance for urgent and routine care needs. 3. The patient may stop CCM services at any time (effective at the end of the month) by phone call to the office staff.  Patient agreed to services and verbal consent obtained.   The patient verbalized understanding of instructions provided today and declined a print copy of patient instruction materials.   The care management team will reach out to the patient again over the next 14 days.  The patient has been provided with contact information for the care management team and has been advised to call with any health related questions or concerns.   [name] RN, BSN, [facility] Care Management Coordinator [facility] Phone: [phone] Fax: [phone]

## 2019-08-22 NOTE — Chronic Care Management (AMB) (Signed)
Care Management   Follow Up Note   08/22/2019 Name: Glen Bauer MRN: [id number] DOB: 1980/08/10  Referred by: Melene Plan, MD Reason for referral : No chief complaint on file.   Glen Bauer is a 39 y.o. year old male who is a primary care patient of Melene Plan, MD. The care management team was consulted for assistance with care management and care coordination needs.    Review of patient status, including review of consultants reports, relevant laboratory and other test results, and collaboration with appropriate care team members and the patient's provider was performed as part of comprehensive patient evaluation and provision of chronic care management services.    SDOH (Social Determinants of Health) screening performed today: Physical Activity. See Care Plan for related entries.   Advanced Directives: See Care Plan and Vynca application for related entries.   Goals Addressed            This Visit's Progress   . Needs to keep a check on his blood pressure (pt-stated)       Current Barriers:  [name] Knowledge Deficits related to basic understanding of hypertension  self care management  Case Manager Clinical Goal(s):  [name] Over the next 30 days, patient/mother will verbalize understanding of plan for hypertension management  Interventions:  . Evaluation of current treatment plan related to hypertension self management and patient's adherence to plan as established by provider. . Reviewed medications with patient and discussed importance of compliance . Discussed plans with patient for ongoing care management follow up and provided patient with direct contact information for care management team . Advised patient, providing education and rationale, to monitor blood pressure daily and record, calling PCP for findings outside established parameters.  . Follow low sodium diet  . 08/21/19 . Spoke with Mrs. Arnell and she has been recording blood pressures .      1/31   167/120 HR  71 2/2   172/111 HR  70 2/3   162/99   HR  67 2/4   145/97   HR  73 2/5   170/108 HR  64 2/6   151/116 HR  68 . She states that he takes his medications daily . He does not have a lot of sodium in his diet . She does states that he does eat  a lot= discussed cutting down on his food intake . Discussed him doing exercise in the home being more active.  She states that he like to dance, or walk more in the home nothing strenuous. . She states that he drinks water. He drinks a lot of water, he wets the bed at night.  I suggested maybe cutting his last drinking time at 6 pm to see if that could help  . Call care management team with any questions or concerns . Will send education information "A matter of choice blood pressure control"    Patient Self Care Activities:  . Patient takes medications daily . Patient is unable to self manage chronic condition  Please see past updates related to this goal by clicking on the "Past Updates" button in the selected goal           The care management team will reach out to the patient again over the next 14 days.  The patient has been provided with contact information for the care management team and has been advised to call with any health related questions or concerns.   [name] RN, BSN, [facility] Care  Management Coordinator [place] Phone: [phone] Fax: [phone]

## 2019-08-24 ENCOUNTER — Other Ambulatory Visit: Payer: Self-pay | Admitting: *Deleted

## 2019-08-24 MED ORDER — METOPROLOL SUCCINATE ER 100 MG PO TB24: ORAL_TABLET | ORAL | 0 refills | Status: DC

## 2019-08-24 NOTE — Telephone Encounter (Signed)
Per CCM note on August 22, 2019, patient's blood pressures elevated at home.  At last visit, blood pressure lower at 112/71 and was considering tapering off 1 medication at that time.  Currently his hypertension regimen includes Benzapril 40 mg, amlodipine 10 mg, hydrochlorothiazide 25 mg and Toprol-XL 100 mg.  Can consider modifications such as switching hydrochlorothiazide to chlorthalidone. Can consider adding medication versus looking for other secondary causes of hypertension.  This would include OSA, thyroid disease (last TSH done in 2012), and renal disease (last GFR 112).  If patient were tested for OSA and was positive, I am unsure if he would be able to tolerate a sleep machine at night. I wonder when mom is taking these blood pressures during the day, as this can affect how high his blood pressures are.  Additionally, knowing what kind of blood pressure cuff is being used can be helpful as well.   Plan as follows: 1. Please ask mom to change patient's hypertension medications to nighttime dosing with the exception of chlorthalidone. 2. Please ask mom to take BP at the same time every day, either in the morning or in the evening if not already doing this.  3. Please ask mom to make appointment to come into the office bringing all medications and their blood pressure cuff from home. 4. Will refill requested metoprolol prescription at this time.  Thank you,    Melene Plan, M.D.  3:08 PM 08/24/2019

## 2019-08-25 NOTE — Telephone Encounter (Signed)
Spoke with mom and informed of the below. She will make the changes and check his BPs at the same time daily after he rest for at least 5 minutes.  She also wants to let Dr. Selena Batten know what she started him on a diet and is hoping that will help his weight and BP.  She is not comfortable leaving the house currently but will call back to make an appt soon. Glen Bauer

## 2019-09-05 ENCOUNTER — Ambulatory Visit: Payer: Medicare Other

## 2019-09-05 NOTE — Chronic Care Management (AMB) (Signed)
  Care Management   Outreach Note  09/05/2019 Name: Glen Bauer MRN: [id number] DOB: May 11, 1981  Referred by: Melene Plan, MD Reason for referral : Care Coordination (Care Management HTN/Obesity)   An unsuccessful telephone outreach was attempted today. The patient was referred to the case management team for assistance with care management and care coordination.   Follow Up Plan: A HIPPA compliant phone message was left for the patient providing contact information and requesting a return call.  The care management team will reach out to the patient again over the next 5-7 days.   [name] RN, BSN, [facility] Care Management Coordinator [facility] Phone: [phone] Fax: [phone]

## 2019-09-12 ENCOUNTER — Ambulatory Visit: Payer: Medicare Other

## 2019-09-12 ENCOUNTER — Other Ambulatory Visit: Payer: Self-pay

## 2019-09-12 NOTE — Patient Instructions (Signed)
Visit Information  Goals Addressed            This Visit's Progress   . Needs to keep a check on his blood pressure (pt-stated)       Current Barriers:  [name] Knowledge Deficits related to basic understanding of hypertension  self care management  Case Manager Clinical Goal(s):  [name] Over the next 30 days, patient/mother will verbalize understanding of plan for hypertension management  Interventions:  . Evaluation of current treatment plan related to hypertension self management and patient's adherence to plan as established by provider. . Reviewed medications with patient and discussed importance of compliance . Discussed plans with patient for ongoing care management follow up and provided patient with direct contact information for care management team . Advised patient, providing education and rationale, to monitor blood pressure daily and record, calling PCP for findings outside established parameters.  . Follow low sodium diet  . 08/21/19 . Spoke with [name] and she has been recording blood pressures .      1/31  167/120 HR  71 2/2   172/111 HR  70 2/3   162/99   HR  67 2/4   145/97   HR  73 2/5   170/108 HR  64 2/6   151/116 HR  68 . She states that he takes his medications daily . He does not have a lot of sodium in his diet . She does states that he does eat  a lot= discussed cutting down on his food intake . Discussed him doing exercise in the home being more active.  She states that he like to dance, or walk more in the home nothing strenuous. . She states that he drinks water. He drinks a lot of water, he wets the bed at night.  I suggested maybe cutting his last drinking time at 6 pm to see if that could help  . Call care management team with any questions or concerns . Will send education information "A matter of choice blood pressure control"  . 09/12/19 . Spoke with [name] and she states that she has put the patient on a diet and has started to bake his food.  She  is feeding him more vegetables, and giving him oatmeal for breakfast in the morning. . She states that she is checking his blood pressure daily. It is not at the same time.  I advised to check at the same time and for him to rest at least 5 minutes before checking . She stated she understood.  She will call me back tomorrow with values she did not have them near her at the time.    Patient Self Care Activities:  . Patient takes medications daily . Patient is unable to self manage chronic condition  Please see past updates related to this goal by clicking on the "Past Updates" button in the selected goal          Glen Bauer was given information about Care Management services today including:  1. Care Management services include personalized support from designated clinical staff supervised by his physician, including individualized plan of care and coordination with other care providers 2. 24/7 contact phone numbers for assistance for urgent and routine care needs. 3. The patient may stop CCM services at any time (effective at the end of the month) by phone call to the office staff.  Patient agreed to services and verbal consent obtained.   The patient verbalized understanding of instructions provided today  and declined a print copy of patient instruction materials.   The care management team will reach out to the patient again over the next 14 days.  The patient has been provided with contact information for the care management team and has been advised to call with any health related questions or concerns.   [name] RN, BSN, [facility] Care Management Coordinator [facility] Phone: [phone] Fax: [phone]

## 2019-09-12 NOTE — Chronic Care Management (AMB) (Signed)
Care Management   Follow Up Note   09/12/2019 Name: Glen Bauer MRN: [id number] DOB: 1981/02/26  Referred by: Wilber Oliphant, MD Reason for referral : Care Coordination (Care Management HTN/Obesity)   Glen Bauer is a 39 y.o. year old male who is a primary care patient of Maudie Mercury Charlyne Quale, MD. The care management team was consulted for assistance with care management and care coordination needs.    Review of patient status, including review of consultants reports, relevant laboratory and other test results, and collaboration with appropriate care team members and the patient's provider was performed as part of comprehensive patient evaluation and provision of chronic care management services.    SDOH (Social Determinants of Health) assessments performed: No See Care Plan activities for detailed interventions related to [facility])     Advanced Directives: See Care Plan and Vynca application for related entries.   Goals Addressed            This Visit's Progress   . Needs to keep a check on his blood pressure (pt-stated)       Current Barriers:  [name] Knowledge Deficits related to basic understanding of hypertension  self care management  Case Manager Clinical Goal(s):  [name] Over the next 30 days, patient/mother will verbalize understanding of plan for hypertension management  Interventions:  . Evaluation of current treatment plan related to hypertension self management and patient's adherence to plan as established by provider. . Reviewed medications with patient and discussed importance of compliance . Discussed plans with patient for ongoing care management follow up and provided patient with direct contact information for care management team . Advised patient, providing education and rationale, to monitor blood pressure daily and record, calling PCP for findings outside established parameters.  . Follow low sodium diet  . 08/21/19 . Spoke with Mrs. Freiman and she has been  recording blood pressures .      1/31  167/120 HR  71 2/2   172/111 HR  70 2/3   162/99   HR  67 2/4   145/97   HR  73 2/5   170/108 HR  64 2/6   151/116 HR  68 . She states that he takes his medications daily . He does not have a lot of sodium in his diet . She does states that he does eat  a lot= discussed cutting down on his food intake . Discussed him doing exercise in the home being more active.  She states that he like to dance, or walk more in the home nothing strenuous. . She states that he drinks water. He drinks a lot of water, he wets the bed at night.  I suggested maybe cutting his last drinking time at 6 pm to see if that could help  . Call care management team with any questions or concerns . Will send education information "A matter of choice blood pressure control"  . 09/12/19 . Spoke with Mrs Yearwood and she states that she has put the patient on a diet and has started to bake his food.  She is feeding him more vegetables, and giving him oatmeal for breakfast in the morning. . She states that she is checking his blood pressure daily. It is not at the same time.  I advised to check at the same time and for him to rest at least 5 minutes before checking . She stated she understood.  She will call me back tomorrow with values she did not have  them near her at the time.    Patient Self Care Activities:  . Patient takes medications daily . Patient is unable to self manage chronic condition  Please see past updates related to this goal by clicking on the "Past Updates" button in the selected goal           The care management team will reach out to the patient again over the next 14 days.  The patient has been provided with contact information for the care management team and has been advised to call with any health related questions or concerns.   SIGNATURE

## 2019-09-26 ENCOUNTER — Ambulatory Visit: Payer: Medicare Other

## 2019-09-26 ENCOUNTER — Other Ambulatory Visit: Payer: Self-pay

## 2019-09-26 NOTE — Chronic Care Management (AMB) (Signed)
  Care Management   Outreach Note  09/26/2019 Name: Glen Bauer MRN: [id number] DOB: 1981/06/25  Referred by: Melene Plan, MD Reason for referral : Care Coordination (Care management RNCM HTn)   An unsuccessful telephone outreach was attempted today. The patient was referred to the case management team for assistance with care management and care coordination.   Follow Up Plan: Phone rang several times no voicemail pickup.   The care management team will reach out to the patient again over the next 5-7 days.   [name] RN, BSN, [facility] Care Management Coordinator [facility] Phone: [phone] Fax: [phone]

## 2019-09-29 ENCOUNTER — Ambulatory Visit: Payer: Medicare Other

## 2019-09-29 ENCOUNTER — Other Ambulatory Visit: Payer: Self-pay

## 2019-09-29 NOTE — Patient Instructions (Signed)
Visit Information  Goals Addressed            This Visit's Progress   . Needs to keep a check on his blood pressure (pt-stated)       Current Barriers:  [name] Knowledge Deficits related to basic understanding of hypertension  self care management  Case Manager Clinical Goal(s):  [name] Over the next 30 days, patient/mother will verbalize understanding of plan for hypertension management  Interventions:  . Evaluation of current treatment plan related to hypertension self management and patient's adherence to plan as established by provider. . Reviewed medications with patient and discussed importance of compliance . Discussed plans with patient for ongoing care management follow up and provided patient with direct contact information for care management team . Advised patient, providing education and rationale, to monitor blood pressure daily and record, calling PCP for findings outside established parameters.  . Follow low sodium diet  . 08/21/19 . Spoke with Mrs. Aleshire and she has been recording blood pressures .      1/31  167/120 HR  71 2/2   172/111 HR  70 2/3   162/99   HR  67 2/4   145/97   HR  73 2/5   170/108 HR  64 2/6   151/116 HR  68 . She states that he takes his medications daily . He does not have a lot of sodium in his diet . She does states that he does eat  a lot= discussed cutting down on his food intake . Discussed him doing exercise in the home being more active.  She states that he like to dance, or walk more in the home nothing strenuous. . She states that he drinks water. He drinks a lot of water, he wets the bed at night.  I suggested maybe cutting his last drinking time at 6 pm to see if that could help  . Call care management team with any questions or concerns . Will send education information "A matter of choice blood pressure control"  . 09/12/19 . Spoke with Mrs Bosserman and she states that she has put the patient on a diet and has started to bake his food.  She  is feeding him more vegetables, and giving him oatmeal for breakfast in the morning. . She states that she is checking his blood pressure daily. It is not at the same time.  I advised to check at the same time and for him to rest at least 5 minutes before checking . She stated she understood.  She will call me back tomorrow with values she did not have them near her at the time. . 09/29/19 . Spoke with Mrs. Pagliuca and she was sleeping  she asked if I could give her a callback on Monday 10/02/19 after 12.  She would be able to give me some of his blood pressure readings then.    Patient Self Care Activities:  . Patient takes medications daily . Patient is unable to self manage chronic condition  Please see past updates related to this goal by clicking on the "Past Updates" button in the selected goal          Mr. Loos was given information about Care Management services today including:  1. Care Management services include personalized support from designated clinical staff supervised by his physician, including individualized plan of care and coordination with other care providers 2. 24/7 contact phone numbers for assistance for urgent and routine care needs. 3. The  patient may stop CCM services at any time (effective at the end of the month) by phone call to the office staff.  Patient agreed to services and verbal consent obtained.   The patient verbalized understanding of instructions provided today and declined a print copy of patient instruction materials.   The care management team will reach out to the patient again over the next 5 days.  The patient has been provided with contact information for the care management team and has been advised to call with any health related questions or concerns.   [name] RN, BSN, [facility] Care Management Coordinator [place] Phone: [phone] Fax: [phone]

## 2019-09-29 NOTE — Chronic Care Management (AMB) (Signed)
Care Management   Follow Up Note   09/29/2019 Name: Glen Bauer MRN: [id number] DOB: 1980-12-04  Referred by: Melene Plan, MD Reason for referral : Care Coordination (Care Managment RNCM HTN 2nd attempt)   Glen Bauer is a 39 y.o. year old male who is a primary care patient of Selena Batten Vinnie Langton, MD. The care management team was consulted for assistance with care management and care coordination needs.    Review of patient status, including review of consultants reports, relevant laboratory and other test results, and collaboration with appropriate care team members and the patient's provider was performed as part of comprehensive patient evaluation and provision of chronic care management services.    SDOH (Social Determinants of Health) assessments performed: No See Care Plan activities for detailed interventions related to [facility])     Advanced Directives: See Care Plan and Vynca application for related entries.   Goals Addressed            This Visit's Progress   . Needs to keep a check on his blood pressure (pt-stated)       Current Barriers:  [name] Knowledge Deficits related to basic understanding of hypertension  self care management  Case Manager Clinical Goal(s):  [name] Over the next 30 days, patient/mother will verbalize understanding of plan for hypertension management  Interventions:  . Evaluation of current treatment plan related to hypertension self management and patient's adherence to plan as established by provider. . Reviewed medications with patient and discussed importance of compliance . Discussed plans with patient for ongoing care management follow up and provided patient with direct contact information for care management team . Advised patient, providing education and rationale, to monitor blood pressure daily and record, calling PCP for findings outside established parameters.  . Follow low sodium diet  . 08/21/19 . Spoke with Mrs. Matar and she has been  recording blood pressures .      1/31  167/120 HR  71 2/2   172/111 HR  70 2/3   162/99   HR  67 2/4   145/97   HR  73 2/5   170/108 HR  64 2/6   151/116 HR  68 . She states that he takes his medications daily . He does not have a lot of sodium in his diet . She does states that he does eat  a lot= discussed cutting down on his food intake . Discussed him doing exercise in the home being more active.  She states that he like to dance, or walk more in the home nothing strenuous. . She states that he drinks water. He drinks a lot of water, he wets the bed at night.  I suggested maybe cutting his last drinking time at 6 pm to see if that could help  . Call care management team with any questions or concerns . Will send education information "A matter of choice blood pressure control"  . 09/12/19 . Spoke with Mrs Wold and she states that she has put the patient on a diet and has started to bake his food.  She is feeding him more vegetables, and giving him oatmeal for breakfast in the morning. . She states that she is checking his blood pressure daily. It is not at the same time.  I advised to check at the same time and for him to rest at least 5 minutes before checking . She stated she understood.  She will call me back tomorrow with values she  did not have them near her at the time. . 09/29/19 . Spoke with Mrs. Szczerba and she was sleeping  she asked if I could give her a callback on Monday 10/02/19 after 12.  She would be able to give me some of his blood pressure readings then.    Patient Self Care Activities:  . Patient takes medications daily . Patient is unable to self manage chronic condition  Please see past updates related to this goal by clicking on the "Past Updates" button in the selected goal           The care management team will reach out to the patient again over the next 5 days.  The patient has been provided with contact information for the care management team and has  been advised to call with any health related questions or concerns.   [name] RN, BSN, [facility] Care Management Coordinator [place] Phone: [phone] Fax: [phone]

## 2019-10-02 ENCOUNTER — Ambulatory Visit: Payer: Medicare Other

## 2019-10-02 ENCOUNTER — Other Ambulatory Visit: Payer: Self-pay

## 2019-10-02 NOTE — Chronic Care Management (AMB) (Signed)
  Care Management   Outreach Note  10/02/2019 Name: Glen Bauer MRN: [id number] DOB: Jun 01, 1981  Referred by: Melene Plan, MD Reason for referral : Care Coordination (Care Management RNCM HTN F/U)   An unsuccessful telephone outreach was attempted today. The patient was referred to the case management team for assistance with care management and care coordination.   Follow Up Plan: A HIPPA compliant phone message was left for the patient providing contact information and requesting a return call.  The care management team will reach out to the patient again over the next 10 days.   [name] RN, BSN, [facility] Care Management Coordinator [facility] Phone: [phone] Fax: [phone]

## 2019-10-12 ENCOUNTER — Other Ambulatory Visit: Payer: Self-pay

## 2019-10-12 ENCOUNTER — Ambulatory Visit: Payer: Medicare Other

## 2019-10-12 NOTE — Patient Instructions (Signed)
Visit Information  Goals Addressed            This Visit's Progress   . Needs to keep a check on his blood pressure (pt-stated)       Current Barriers:  [name] Knowledge Deficits related to basic understanding of hypertension  self care management  Case Manager Clinical Goal(s):  [name] Over the next 30 days, patient/mother will verbalize understanding of plan for hypertension management  Interventions:  . Evaluation of current treatment plan related to hypertension self management and patient's adherence to plan as established by provider. . Reviewed medications with patient and discussed importance of compliance . Discussed plans with patient for ongoing care management follow up and provided patient with direct contact information for care management team . Advised patient, providing education and rationale, to monitor blood pressure daily and record, calling PCP for findings outside established parameters.  . Follow low sodium diet  . 08/21/19 . Spoke with Mrs. Perz and she has been recording blood pressures .      1/31  167/120 HR  71 2/2   172/111 HR  70 2/3   162/99   HR  67 2/4   145/97   HR  73 2/5   170/108 HR  64 2/6   151/116 HR  68 . She states that he takes his medications daily . He does not have a lot of sodium in his diet . She does states that he does eat  a lot= discussed cutting down on his food intake . Discussed him doing exercise in the home being more active.  She states that he like to dance, or walk more in the home nothing strenuous. . She states that he drinks water. He drinks a lot of water, he wets the bed at night.  I suggested maybe cutting his last drinking time at 6 pm to see if that could help  . Call care management team with any questions or concerns . Will send education information "A matter of choice blood pressure control"  . 09/12/19 . Spoke with Mrs Debruin and she states that she has put the patient on a diet and has started to bake his food.  She  is feeding him more vegetables, and giving him oatmeal for breakfast in the morning. . She states that she is checking his blood pressure daily. It is not at the same time.  I advised to check at the same time and for him to rest at least 5 minutes before checking . She stated she understood.  She will call me back tomorrow with values she did not have them near her at the time. . 09/29/19 . Spoke with Mrs. Woodfield and she was sleeping  she asked if I could give her a callback on Monday 10/02/19 after 12.  She would be able to give me some of his blood pressure readings then. . 10/12/19 . Spoke with Mrs. Blumberg and she states that her blood pressure monitor broke and she has not been able to check the patient blood pressures recently but she did give me the last readings.  She is planning on going out to buy another blood pressure monitor. . 2/26 113/60   60    1025 am . 3/1   117/80   60      10/am . 3/2   129/82   60      10 am . 3/19 141/95   59     930 am . 3/20  131/98   60     1010 am . 3/21 137/97   60      930 am . 3/23 167/105 66    150 pm . She states that she is still monitoring his food  . Making him take his medications as prescribed . He exercises in the house 7-8 minutes a day . She feels that he has lost weight because his clothes have become loose on him.  She does not have a scale    Patient Self Care Activities:  . Patient takes medications daily . Patient is unable to self manage chronic condition  Please see past updates related to this goal by clicking on the "Past Updates" button in the selected goal          Mr. Schmader was given information about Care Management services today including:  1. Care Management services include personalized support from designated clinical staff supervised by his physician, including individualized plan of care and coordination with other care providers 2. 24/7 contact phone numbers for assistance for urgent and routine care needs. 3. The  patient may stop CCM services at any time (effective at the end of the month) by phone call to the office staff.  Patient agreed to services and verbal consent obtained.   The patient verbalized understanding of instructions provided today and declined a print copy of patient instruction materials.   The care management team will reach out to the patient again over the next 14 days.  The patient has been provided with contact information for the care management team and has been advised to call with any health related questions or concerns.   [name] RN, BSN, [facility] Care Management Coordinator [place] Phone: [phone] Fax: [phone]

## 2019-10-12 NOTE — Chronic Care Management (AMB) (Signed)
Care Management   Follow Up Note   10/12/2019 Name: Glen Bauer MRN: [id number] DOB: 1980/07/29  Referred by: Melene Plan, MD Reason for referral : Care Coordination (Care Management RNCM HTN Obesity )   Glen Bauer is a 39 y.o. year old male who is a primary care patient of Selena Batten Vinnie Langton, MD. The care management team was consulted for assistance with care management and care coordination needs.    Review of patient status, including review of consultants reports, relevant laboratory and other test results, and collaboration with appropriate care team members and the patient's provider was performed as part of comprehensive patient evaluation and provision of chronic care management services.    SDOH (Social Determinants of Health) assessments performed: No See Care Plan activities for detailed interventions related to [facility])     Advanced Directives: See Care Plan and Vynca application for related entries.   Goals Addressed            This Visit's Progress   . Needs to keep a check on his blood pressure (pt-stated)       Current Barriers:  Glen Bauer Knowledge Deficits related to basic understanding of hypertension  self care management  Case Manager Clinical Goal(s):  Glen Bauer Over the next 30 days, patient/mother will verbalize understanding of plan for hypertension management  Interventions:  . Evaluation of current treatment plan related to hypertension self management and patient's adherence to plan as established by provider. . Reviewed medications with patient and discussed importance of compliance . Discussed plans with patient for ongoing care management follow up and provided patient with direct contact information for care management team . Advised patient, providing education and rationale, to monitor blood pressure daily and record, calling PCP for findings outside established parameters.  . Follow low sodium diet  . 08/21/19 . Spoke with Glen Bauer and she has been  recording blood pressures .      1/31  167/120 HR  71 2/2   172/111 HR  70 2/3   162/99   HR  67 2/4   145/97   HR  73 2/5   170/108 HR  64 2/6   151/116 HR  68 . She states that he takes his medications daily . He does not have a lot of sodium in his diet . She does states that he does eat  a lot= discussed cutting down on his food intake . Discussed him doing exercise in the home being more active.  She states that he like to dance, or walk more in the home nothing strenuous. . She states that he drinks water. He drinks a lot of water, he wets the bed at night.  I suggested maybe cutting his last drinking time at 6 pm to see if that could help  . Call care management team with any questions or concerns . Will send education information "A matter of choice blood pressure control"  . 09/12/19 . Spoke with Mrs Bauer and she states that she has put the patient on a diet and has started to bake his food.  She is feeding him more vegetables, and giving him oatmeal for breakfast in the morning. . She states that she is checking his blood pressure daily. It is not at the same time.  I advised to check at the same time and for him to rest at least 5 minutes before checking . She stated she understood.  She will call me back tomorrow with values she  did not have them near her at the time. . 09/29/19 . Spoke with Glen Bauer and she was sleeping  she asked if I could give her a callback on Monday 10/02/19 after 12.  She would be able to give me some of his blood pressure readings then. . 10/12/19 . Spoke with Glen Bauer and she states that her blood pressure monitor broke and she has not been able to check the patient blood pressures recently but she did give me the last readings.  She is planning on going out to buy another blood pressure monitor. . 2/26 113/60   60    1025 am . 3/1   117/80   60      10/am . 3/2   129/82   60      10 am . 3/19 141/95   59     930 am . 3/20 131/98   60     1010 am . 3/21  137/97   60      930 am . 3/23 167/105 66    150 pm . She states that she is still monitoring his food  . Making him take his medications as prescribed . He exercises in the house 7-8 minutes a day . She feels that he has lost weight because his clothes have become loose on him.  She does not have a scale    Patient Self Care Activities:  . Patient takes medications daily . Patient is unable to self manage chronic condition  Please see past updates related to this goal by clicking on the "Past Updates" button in the selected goal           The care management team will reach out to the patient again over the next 14 days.  The patient has been provided with contact information for the care management team and has been advised to call with any health related questions or concerns.   Glen Arms RN, BSN, [facility] Care Management Coordinator [place] Phone: [phone] Fax: [phone]

## 2019-10-26 ENCOUNTER — Ambulatory Visit: Payer: Medicare Other

## 2019-10-26 ENCOUNTER — Other Ambulatory Visit: Payer: Self-pay

## 2019-10-26 NOTE — Chronic Care Management (AMB) (Signed)
  Care Management   Outreach Note  10/26/2019 Name: Glen Bauer MRN: [id number] DOB: [birth year]  Referred by: Melene Plan, MD Reason for referral : Care Coordination (Care Management RNCM HTN /)   An unsuccessful telephone outreach was attempted today. The patient was referred to the case management team for assistance with care management and care coordination.  Patient husband stated she was unable to talk.  Follow Up Plan: A HIPPA compliant phone message was left for the patient providing contact information and requesting a return call.  The care management team will reach out to the patient again over the next 5-7 days.   [name] RN, BSN, [facility] Care Management Coordinator [facility] Phone: [phone] Fax: [phone]

## 2019-10-29 ENCOUNTER — Other Ambulatory Visit: Payer: Self-pay | Admitting: Family Medicine

## 2019-10-29 DIAGNOSIS — I10 Essential (primary) hypertension: Secondary | ICD-10-CM

## 2019-10-31 NOTE — Telephone Encounter (Signed)
I have received multiple prescription requests from this patient.  I have noted previously that if I cannot see him in clinic, I cannot continue to give him medication.  He has missed recent appointments and it has been very difficult to get a hold of him.  It looks like you will have an appointment tomorrow with him.  If you are able to reach, please ask mom to schedule appointment to appropriately manage medications that patient needs for hypertension. I will not refill this medication at this time. I know that they have a difficult situation at home, but I cannot continue to treat without contact.   Additionally, I was also patient's mother's provider Maralyn Sago [place]).  I continue to receive medication requests from her, though she reports in her last CCM note that she could not come to this practice anymore due to insurance purposes.  I have also not been able to reach her directly.  Please let me know if there is anything I can do to help with her care as she transitions.   Thank you so much, Traci. You are so appreciated.   Fleet Contras

## 2019-11-01 ENCOUNTER — Ambulatory Visit: Payer: Medicare Other

## 2019-11-01 ENCOUNTER — Other Ambulatory Visit: Payer: Self-pay

## 2019-11-03 ENCOUNTER — Other Ambulatory Visit: Payer: Self-pay

## 2019-11-03 NOTE — Telephone Encounter (Signed)
I read your note about not refilling this Rx until he sees you. Pt has an appt in May. I am sending this refill request not knowing if you will refill with an appt scheduled. Sunday Spillers, CMA

## 2019-11-06 MED ORDER — BENAZEPRIL HCL 40 MG PO TABS: ORAL_TABLET | ORAL | 0 refills | Status: DC

## 2019-11-08 ENCOUNTER — Other Ambulatory Visit: Payer: Self-pay | Admitting: *Deleted

## 2019-11-08 DIAGNOSIS — I10 Essential (primary) hypertension: Secondary | ICD-10-CM

## 2019-11-09 MED ORDER — AMLODIPINE BESYLATE 10 MG PO TABS: 10.0000 mg | ORAL_TABLET | Freq: Every day | ORAL | 0 refills | Status: DC

## 2019-11-17 NOTE — Chronic Care Management (AMB) (Signed)
Care Management   Follow Up Note   11/17/2019 Name: Glen Bauer MRN: [id number] DOB: Aug 31, 1980  Referred by: Melene Plan, MD Reason for referral : Care Coordination (Care Management RNCM HTN/Obesity)   Glen Bauer is a 39 y.o. year old male who is a primary care patient of Glen Batten Vinnie Langton, MD. The care management team was consulted for assistance with care management and care coordination needs.    Review of patient status, including review of consultants reports, relevant laboratory and other test results, and collaboration with appropriate care team members and the patient's provider was performed as part of comprehensive patient evaluation and provision of chronic care management services.    SDOH (Social Determinants of Health) assessments performed: No See Care Plan activities for detailed interventions related to [facility])     Advanced Directives: See Care Plan and Vynca application for related entries.   Goals Addressed            This Visit's Progress   . Needs to keep a check on his blood pressure (pt-stated)       Current Barriers:  [name] Knowledge Deficits related to basic understanding of hypertension  self care management  Case Manager Clinical Goal(s):  [name] Over the next 30 days, patient/mother will verbalize understanding of plan for hypertension management  Interventions:  . Evaluation of current treatment plan related to hypertension self management and patient's adherence to plan as established by provider. . Reviewed medications with patient and discussed importance of compliance . Discussed plans with patient for ongoing care management follow up and provided patient with direct contact information for care management team . Advised patient, providing education and rationale, to monitor blood pressure daily and record, calling PCP for findings outside established parameters.  . Follow low sodium diet  . 08/21/19 . Spoke with Glen Bauer and she has been  recording blood pressures .      1/31  167/120 HR  71 2/2   172/111 HR  70 2/3   162/99   HR  67 2/4   145/97   HR  73 2/5   170/108 HR  64 2/6   151/116 HR  68 . She states that he takes his medications daily . He does not have a lot of sodium in his diet . She does states that he does eat  a lot= discussed cutting down on his food intake . Discussed him doing exercise in the home being more active.  She states that he like to dance, or walk more in the home nothing strenuous. . She states that he drinks water. He drinks a lot of water, he wets the bed at night.  I suggested maybe cutting his last drinking time at 6 pm to see if that could help  . Call care management team with any questions or concerns . Will send education information "A matter of choice blood pressure control"  . 09/12/19 . Spoke with Glen Bauer and she states that she has put the patient on a diet and has started to bake his food.  She is feeding him more vegetables, and giving him oatmeal for breakfast in the morning. . She states that she is checking his blood pressure daily. It is not at the same time.  I advised to check at the same time and for him to rest at least 5 minutes before checking . She stated she understood.  She will call me back tomorrow with values she did not  have them near her at the time. . 09/29/19 . Spoke with Glen Bauer and she was sleeping  she asked if I could give her a callback on Monday 10/02/19 after 12.  She would be able to give me some of his blood pressure readings then. . 10/12/19 . Spoke with Glen Bauer and she states that her blood pressure monitor broke and she has not been able to check the patient blood pressures recently but she did give me the last readings.  She is planning on going out to buy another blood pressure monitor. . 2/26 113/60   60    1025 am . 3/1   117/80   60      10/am . 3/2   129/82   60      10 am . 3/19 141/95   59     930 am . 3/20 131/98   60     1010 am . 3/21  137/97   60      930 am . 3/23 167/105 66    150 pm . She states that she is still monitoring his food  . Making him take his medications as prescribed . He exercises in the house 7-8 minutes a day . She feels that he has lost weight because his clothes have become loose on him.  She does not have a scale . 11/01/19 . Glen Bauer has not been checking his BP because the machine broke but she has been keeping him on the diet . She needs to get his medications filled but has enough to last until the appointment RNCM has set up for him on 11/28/19 with Dr Maudie Mercury. . Glen Bauer feels the patient has lost weight because his cloths have become lose on him.    Patient Self Care Activities:  . Patient takes medications daily . Patient is unable to self manage chronic condition  Please see past updates related to this goal by clicking on the "Past Updates" button in the selected goal           The care management team will reach out to the patient again over the next 14 days.  The patient has been provided with contact information for the care management team and has been advised to call with any health related questions or concerns.   [name] RN, BSN, [facility] Care Management Coordinator [place] Phone: [phone] Fax: [phone]

## 2019-11-17 NOTE — Patient Instructions (Addendum)
Visit Information  Goals Addressed            This Visit's Progress   . Needs to keep a check on his blood pressure (pt-stated)       Current Barriers:  [name] Knowledge Deficits related to basic understanding of hypertension  self care management  Case Manager Clinical Goal(s):  [name] Over the next 30 days, patient/mother will verbalize understanding of plan for hypertension management  Interventions:  . Evaluation of current treatment plan related to hypertension self management and patient's adherence to plan as established by provider. . Reviewed medications with patient and discussed importance of compliance . Discussed plans with patient for ongoing care management follow up and provided patient with direct contact information for care management team . Advised patient, providing education and rationale, to monitor blood pressure daily and record, calling PCP for findings outside established parameters.  . Follow low sodium diet  . 08/21/19 . Spoke with Mrs. Cottrell and she has been recording blood pressures .      1/31  167/120 HR  71 2/2   172/111 HR  70 2/3   162/99   HR  67 2/4   145/97   HR  73 2/5   170/108 HR  64 2/6   151/116 HR  68 . She states that he takes his medications daily . He does not have a lot of sodium in his diet . She does states that he does eat  a lot= discussed cutting down on his food intake . Discussed him doing exercise in the home being more active.  She states that he like to dance, or walk more in the home nothing strenuous. . She states that he drinks water. He drinks a lot of water, he wets the bed at night.  I suggested maybe cutting his last drinking time at 6 pm to see if that could help  . Call care management team with any questions or concerns . Will send education information "A matter of choice blood pressure control"  . 09/12/19 . Spoke with Mrs Dessert and she states that she has put the patient on a diet and has started to bake his food.  She  is feeding him more vegetables, and giving him oatmeal for breakfast in the morning. . She states that she is checking his blood pressure daily. It is not at the same time.  I advised to check at the same time and for him to rest at least 5 minutes before checking . She stated she understood.  She will call me back tomorrow with values she did not have them near her at the time. . 09/29/19 . Spoke with Mrs. Rafanan and she was sleeping  she asked if I could give her a callback on Monday 10/02/19 after 12.  She would be able to give me some of his blood pressure readings then. . 10/12/19 . Spoke with Mrs. Soloway and she states that her blood pressure monitor broke and she has not been able to check the patient blood pressures recently but she did give me the last readings.  She is planning on going out to buy another blood pressure monitor. . 2/26 113/60   60    1025 am . 3/1   117/80   60      10/am . 3/2   129/82   60      10 am . 3/19 141/95   59     930 am . 3/20  131/98   60     1010 am . 3/21 137/97   60      930 am . 3/23 167/105 66    150 pm . She states that she is still monitoring his food  . Making him take his medications as prescribed . He exercises in the house 7-8 minutes a day . She feels that he has lost weight because his clothes have become loose on him.  She does not have a scale . 11/01/19 . Ms. Guimont has not been checking his BP because the machine broke but she has been keeping him on the diet . She needs to get his medications filled but has enough to last until the appointment RNCM has set up for him on 11/28/19 with Dr Selena Batten. . Ms. Sarinana feels the patient has lost weight because his cloths have become lose on him.    Patient Self Care Activities:  . Patient takes medications daily . Patient is unable to self manage chronic condition  Please see past updates related to this goal by clicking on the "Past Updates" button in the selected goal          Mr. Glen Bauer was given  information about Care Management services today including:  1. Care Management services include personalized support from designated clinical staff supervised by his physician, including individualized plan of care and coordination with other care providers 2. 24/7 contact phone numbers for assistance for urgent and routine care needs. 3. The patient may stop CCM services at any time (effective at the end of the month) by phone call to the office staff.  Patient agreed to services and verbal consent obtained.   The patient verbalized understanding of instructions provided today and declined a print copy of patient instruction materials.   The care management team will reach out to the patient again over the next 14 days.  The patient has been provided with contact information for the care management team and has been advised to call with any health related questions or concerns.   [name] RN, BSN, [facility] Care Management Coordinator [facility] Phone: [phone] Fax: [phone]

## 2019-11-20 ENCOUNTER — Ambulatory Visit: Payer: Medicare Other

## 2019-11-20 ENCOUNTER — Other Ambulatory Visit: Payer: Self-pay

## 2019-11-20 NOTE — Chronic Care Management (AMB) (Signed)
  Care Management   Outreach Note  11/20/2019 Name: ANTOINNE SPADACCINI MRN: [id number] DOB: 1981/04/06  Referred by: Melene Plan, MD Reason for referral : Care Coordination (Care Management RNCM Obesity / HTN)   An unsuccessful telephone outreach was attempted today. The patient was referred to the case management team for assistance with care management and care coordination.   Follow Up Plan: The care management team will reach out to the patient again over the next 7-14 days.   Unable to leave a message no voicemail pickup.  [name] RN, BSN, [facility] Care Management Coordinator [facility] Phone: [phone] Fax: [phone]

## 2019-11-27 ENCOUNTER — Other Ambulatory Visit: Payer: Self-pay

## 2019-11-27 ENCOUNTER — Ambulatory Visit: Payer: Medicare Other

## 2019-11-27 NOTE — Patient Instructions (Signed)
Visit Information  Goals Addressed            This Visit's Progress   . Needs to keep a check on his blood pressure (pt-stated)       Current Barriers:  [name] Knowledge Deficits related to basic understanding of hypertension  self care management  Case Manager Clinical Goal(s):  [name] Over the next 30 days, patient/mother will verbalize understanding of plan for hypertension management  Interventions:  . Evaluation of current treatment plan related to hypertension self management and patient's adherence to plan as established by provider. . Reviewed medications with patient and discussed importance of compliance . Discussed plans with patient for ongoing care management follow up and provided patient with direct contact information for care management team . Advised patient, providing education and rationale, to monitor blood pressure daily and record, calling PCP for findings outside established parameters.  . Follow low sodium diet  . 08/21/19 . Spoke with Mrs. Cottrell and she has been recording blood pressures .      1/31  167/120 HR  71 2/2   172/111 HR  70 2/3   162/99   HR  67 2/4   145/97   HR  73 2/5   170/108 HR  64 2/6   151/116 HR  68 . She states that he takes his medications daily . He does not have a lot of sodium in his diet . She does states that he does eat  a lot= discussed cutting down on his food intake . Discussed him doing exercise in the home being more active.  She states that he like to dance, or walk more in the home nothing strenuous. . She states that he drinks water. He drinks a lot of water, he wets the bed at night.  I suggested maybe cutting his last drinking time at 6 pm to see if that could help  . Call care management team with any questions or concerns . Will send education information "A matter of choice blood pressure control"  . 09/12/19 . Spoke with Mrs Dessert and she states that she has put the patient on a diet and has started to bake his food.  She  is feeding him more vegetables, and giving him oatmeal for breakfast in the morning. . She states that she is checking his blood pressure daily. It is not at the same time.  I advised to check at the same time and for him to rest at least 5 minutes before checking . She stated she understood.  She will call me back tomorrow with values she did not have them near her at the time. . 09/29/19 . Spoke with Mrs. Rafanan and she was sleeping  she asked if I could give her a callback on Monday 10/02/19 after 12.  She would be able to give me some of his blood pressure readings then. . 10/12/19 . Spoke with Mrs. Soloway and she states that her blood pressure monitor broke and she has not been able to check the patient blood pressures recently but she did give me the last readings.  She is planning on going out to buy another blood pressure monitor. . 2/26 113/60   60    1025 am . 3/1   117/80   60      10/am . 3/2   129/82   60      10 am . 3/19 141/95   59     930 am . 3/20  131/98   60     1010 am . 3/21 137/97   60      930 am . 3/23 167/105 66    150 pm . She states that she is still monitoring his food  . Making him take his medications as prescribed . He exercises in the house 7-8 minutes a day . She feels that he has lost weight because his clothes have become loose on him.  She does not have a scale . 11/01/19 . Ms. Bolon has not been checking his BP because the machine broke but she has been keeping him on the diet . She needs to get his medications filled but has enough to last until the appointment RNCM has set up for him on 11/28/19 with Dr Maudie Mercury. . Ms. Cypert feels the patient has lost weight because his cloths have become lose on him. . 11/27/19 . Spoke with Mrs Bouillon and she states that she has not been able to acquire a BP monitor to check the patient's BP.  I advised her that I am going to send her a BP machine in the mail.  She did receive the calendar that I sent her.   . She still has the patient  on a meal plan.  I will follow back up with the patient in about 2 weeks to make sure that she has received the Monitor and recording BP . She states that the patient has an appointment to follow up with the doctor on June 6th at 9:30 am     Patient Self Care Activities:  . Patient takes medications daily . Patient is unable to self manage chronic condition  Please see past updates related to this goal by clicking on the "Past Updates" button in the selected goal          Mr. Rainville was given information about Care Management services today including:  1. Care Management services include personalized support from designated clinical staff supervised by his physician, including individualized plan of care and coordination with other care providers 2. 24/7 contact phone numbers for assistance for urgent and routine care needs. 3. The patient may stop CCM services at any time (effective at the end of the month) by phone call to the office staff.  Patient agreed to services and verbal consent obtained.   The patient verbalized understanding of instructions provided today and declined a print copy of patient instruction materials.   The care management team will reach out to the patient again over the next 14 days.  The patient has been provided with contact information for the care management team and has been advised to call with any health related questions or concerns.   [name] RN, BSN, [facility] Care Management Coordinator [place] Phone: [phone] Fax: [phone]

## 2019-11-27 NOTE — Chronic Care Management (AMB) (Signed)
Care Management   Follow Up Note   11/27/2019 Name: Glen Bauer MRN: [id number] DOB: Jan 24, 1981  Referred by: Melene Plan, MD Reason for referral : Care Coordination (Care Management RNCM  HTN/ Obesity)   Glen Bauer is a 39 y.o. year old male who is a primary care patient of Selena Batten Vinnie Langton, MD. The care management team was consulted for assistance with care management and care coordination needs.    Review of patient status, including review of consultants reports, relevant laboratory and other test results, and collaboration with appropriate care team members and the patient's provider was performed as part of comprehensive patient evaluation and provision of chronic care management services.    SDOH (Social Determinants of Health) assessments performed: No See Care Plan activities for detailed interventions related to [facility])     Advanced Directives: See Care Plan and Vynca application for related entries.   Goals Addressed            This Visit's Progress   . Needs to keep a check on his blood pressure (pt-stated)       Current Barriers:  [name] Knowledge Deficits related to basic understanding of hypertension  self care management  Case Manager Clinical Goal(s):  [name] Over the next 30 days, patient/mother will verbalize understanding of plan for hypertension management  Interventions:  . Evaluation of current treatment plan related to hypertension self management and patient's adherence to plan as established by provider. . Reviewed medications with patient and discussed importance of compliance . Discussed plans with patient for ongoing care management follow up and provided patient with direct contact information for care management team . Advised patient, providing education and rationale, to monitor blood pressure daily and record, calling PCP for findings outside established parameters.  . Follow low sodium diet  . 08/21/19 . Spoke with Mrs. Sehgal and she has been  recording blood pressures .      1/31  167/120 HR  71 2/2   172/111 HR  70 2/3   162/99   HR  67 2/4   145/97   HR  73 2/5   170/108 HR  64 2/6   151/116 HR  68 . She states that he takes his medications daily . He does not have a lot of sodium in his diet . She does states that he does eat  a lot= discussed cutting down on his food intake . Discussed him doing exercise in the home being more active.  She states that he like to dance, or walk more in the home nothing strenuous. . She states that he drinks water. He drinks a lot of water, he wets the bed at night.  I suggested maybe cutting his last drinking time at 6 pm to see if that could help  . Call care management team with any questions or concerns . Will send education information "A matter of choice blood pressure control"  . 09/12/19 . Spoke with Mrs Teasdale and she states that she has put the patient on a diet and has started to bake his food.  She is feeding him more vegetables, and giving him oatmeal for breakfast in the morning. . She states that she is checking his blood pressure daily. It is not at the same time.  I advised to check at the same time and for him to rest at least 5 minutes before checking . She stated she understood.  She will call me back tomorrow with values she  did not have them near her at the time. . 09/29/19 . Spoke with Mrs. Posas and she was sleeping  she asked if I could give her a callback on Monday 10/02/19 after 12.  She would be able to give me some of his blood pressure readings then. . 10/12/19 . Spoke with Mrs. Diekmann and she states that her blood pressure monitor broke and she has not been able to check the patient blood pressures recently but she did give me the last readings.  She is planning on going out to buy another blood pressure monitor. . 2/26 113/60   60    1025 am . 3/1   117/80   60      10/am . 3/2   129/82   60      10 am . 3/19 141/95   59     930 am . 3/20 131/98   60     1010 am . 3/21  137/97   60      930 am . 3/23 167/105 66    150 pm . She states that she is still monitoring his food  . Making him take his medications as prescribed . He exercises in the house 7-8 minutes a day . She feels that he has lost weight because his clothes have become loose on him.  She does not have a scale . 11/01/19 . Ms. Campoy has not been checking his BP because the machine broke but she has been keeping him on the diet . She needs to get his medications filled but has enough to last until the appointment RNCM has set up for him on 11/28/19 with Dr Maudie Mercury. . Ms. Geurts feels the patient has lost weight because his cloths have become lose on him. . 11/27/19 . Spoke with Mrs Elamin and she states that she has not been able to acquire a BP monitor to check the patient's BP.  I advised her that I am going to send her a BP machine in the mail.  She did receive the calendar that I sent her.   . She still has the patient on a meal plan.  I will follow back up with the patient in about 2 weeks to make sure that she has received the Monitor and recording BP . She states that the patient has an appointment to follow up with the doctor on June 6th at 9:30 am     Patient Self Care Activities:  . Patient takes medications daily . Patient is unable to self manage chronic condition  Please see past updates related to this goal by clicking on the "Past Updates" button in the selected goal           The care management team will reach out to the patient again over the next 14 days.  The patient has been provided with contact information for the care management team and has been advised to call with any health related questions or concerns.   [name] RN, BSN, [facility] Care Management Coordinator [place] Phone: [phone] Fax: [phone]

## 2019-11-28 ENCOUNTER — Ambulatory Visit: Payer: Medicare Other | Admitting: Family Medicine

## 2019-12-22 ENCOUNTER — Ambulatory Visit: Payer: Medicare Other | Admitting: Family Medicine

## 2020-02-06 ENCOUNTER — Other Ambulatory Visit: Payer: Self-pay | Admitting: Family Medicine

## 2020-02-06 DIAGNOSIS — I10 Essential (primary) hypertension: Secondary | ICD-10-CM

## 2020-02-07 ENCOUNTER — Other Ambulatory Visit: Payer: Self-pay | Admitting: Family Medicine

## 2020-02-08 NOTE — Telephone Encounter (Signed)
I am not clear on the status of this patient. We have tried to get him and his mom in multiple times for appointments and they have cancelled or no showed. CCM is on board, but it doesn't look like they are having any luck reaching them either. I have said previously that I cannot keep refilling medications without patient being seen as labs also need to be monitored.   Traci, can you try to call the family to see if they can make it to an appointment? I appreciate your help.

## 2020-02-09 ENCOUNTER — Telehealth: Payer: Self-pay

## 2020-02-09 ENCOUNTER — Telehealth: Payer: Medicare Other

## 2020-02-09 ENCOUNTER — Other Ambulatory Visit: Payer: Self-pay

## 2020-02-09 NOTE — Telephone Encounter (Signed)
  Care Management   Outreach Note  02/09/2020 Name: Glen Bauer MRN: [id number] DOB: May 11, 1981  Referred by: Melene Plan, MD Reason for referral : Appointment (HTN)   An unsuccessful telephone outreach was attempted today. The patient was referred to the case management team for assistance with care management and care coordination.   Follow Up Plan: A HIPPA compliant phone message was left for the patient providing contact information and requesting a return call.  The care management team will reach out to the patient again over the next 7-14 days.   [name] RN, BSN, [facility] Care Management Coordinator [facility] Phone: [phone] Fax: [phone]

## 2020-02-12 ENCOUNTER — Telehealth: Payer: Self-pay | Admitting: *Deleted

## 2020-02-12 NOTE — Telephone Encounter (Signed)
Tried to contact pts caregiver to see about getting pt an appointment scheduled for medication management and phone just rang with no option to LVM.Thoms Barthelemy Zimmerman Rumple, CMA

## 2020-02-12 NOTE — Chronic Care Management (AMB) (Signed)
   Care Management   Note  02/12/2020 Name: Glen Bauer MRN: [id number] DOB: 17-Mar-1981  Glen Bauer is a 39 y.o. year old male who is a primary care patient of Melene Plan, MD and is actively engaged with the care management team. I reached out to Jeanella Cara by phone today to assist with re-scheduling a follow up visit with the RN Case Manager  Follow up plan: Unsuccessful telephone outreach attempt made. The care management team will reach out to the patient again over the next 7 days. If patient returns call to provider office, please advise to call Embedded Care Management Care Guide Gwenevere Ghazi at [phone].  Gwenevere Ghazi  Care Guide, Embedded Care Coordination [facility]  [place], [place] [address] Direct Dial: [phone] [email or web address] Website: [email or web address]

## 2020-02-16 NOTE — Telephone Encounter (Addendum)
Several attempts have been made to reach this pt. Letter being mailed asking pt to call our office and schedule an appt. Letter states we can not refill his medications until he meets with his doctor. Sunday Spillers, CMA

## 2020-02-23 NOTE — Chronic Care Management (AMB) (Signed)
  Care Management   Note  02/23/2020 Name: Glen Bauer MRN: [id number] DOB: 29-Jun-1981  Glen Bauer is a 39 y.o. year old male who is a primary care patient of Melene Plan, MD and is actively engaged with the care management team. I reached out to Jeanella Cara by phone today to assist with re-scheduling a follow up visit with the RN Case Manager  Follow up plan: A second unsuccessful telephone outreach attempt made. The care management team will reach out to the patient again over the next 7 days. If patient returns call to provider office, please advise to call Embedded Care Management Care Guide Gwenevere Ghazi at [phone].  Gwenevere Ghazi  Care Guide, Embedded Care Coordination [facility]  [place], [place] [address] Direct Dial: [phone] [email or web address] Website: [email or web address]

## 2020-03-01 NOTE — Chronic Care Management (AMB) (Signed)
  Care Management   Note  03/01/2020 Name: [name] MRN: [id number] DOB: Dec 03, 1980  Glen Bauer is a 39 y.o. year old male who is a primary care patient of Melene Plan, MD and is actively engaged with the care management team. I reached out to Jeanella Cara by phone today to assist with re-scheduling a follow up visit with the RN Case Manager.  Follow up plan: Unsuccessful telephone outreach attempt made. Unable to make contact on outreach attempts x 3. PCP Melene Plan, MD. notified via routed documentation in medical record. The care management team is available to follow up with the patient after provider conversation with the patient regarding recommendation for care management engagement and subsequent re-referral to the care management team.   [facility] Guide, Embedded Care Coordination [facility]  [place], [place] [address] Direct Dial: [phone] [email or web address]: [email or web address]

## 2020-03-04 NOTE — Telephone Encounter (Signed)
Not able to get in touch with patient message PCP and RN CM.

## 2020-04-15 ENCOUNTER — Ambulatory Visit (INDEPENDENT_AMBULATORY_CARE_PROVIDER_SITE_OTHER): Payer: Medicare Other | Admitting: Family Medicine

## 2020-04-15 ENCOUNTER — Encounter: Payer: Self-pay | Admitting: Family Medicine

## 2020-04-15 ENCOUNTER — Other Ambulatory Visit: Payer: Self-pay

## 2020-04-15 VITALS — BP 142/92 | HR 67 | Ht 60.0 in | Wt 188.4 lb

## 2020-04-15 DIAGNOSIS — Z0000 Encounter for general adult medical examination without abnormal findings: Secondary | ICD-10-CM

## 2020-04-15 DIAGNOSIS — I10 Essential (primary) hypertension: Secondary | ICD-10-CM | POA: Diagnosis not present

## 2020-04-15 DIAGNOSIS — Z23 Encounter for immunization: Secondary | ICD-10-CM | POA: Diagnosis present

## 2020-04-15 MED ORDER — LISINOPRIL 5 MG PO TABS
5.0000 mg | ORAL_TABLET | Freq: Every day | ORAL | 3 refills | Status: DC
Start: 2020-04-15 — End: 2021-04-21

## 2020-04-15 NOTE — Patient Instructions (Signed)
High blood pressure: Today, we started a blood pressure medication called lisinopril.  Please come back on Thursday for labs as we need to monitor kidney function while restarting this medication.  Please continue to take his blood pressures at home and monitor them closely. Please follow-up in 1 month for blood pressure check.

## 2020-04-15 NOTE — Progress Notes (Signed)
    SUBJECTIVE:   Chief compliant/HPI: annual examination  Glen Bauer is a 39 y.o. who presents today for an annual exam.   Reviewed and updated history.  CP Obesity Well controlled intermittent asthma  Hypertension  142/92 today.  Does not check BP at home lately, but does report that it is much improved than preivously. Previously on amlodpine 10, benzapril 40, HCTZ 25, troprol XL 100 mg.  Hypertension ROS: not taking medications regularly as instructed, no TIA's, no chest pain on exertion, no dyspnea on exertion and no swelling of ankles.   OBJECTIVE:   BP (!) 142/92   Pulse 67   Ht 5' (1.524 m)   Wt 188 lb 6.4 oz (85.5 kg)   SpO2 96%   BMI 36.79 kg/m   Gen: NAD, alert, non-toxic, sitting comfortably, obese Skin: Warm and dry. No obvious rashes, lesions, or trauma. HEENT: NCAT. EOMI. No conjunctival pallor or injection. No scleral icterus or injection.  MMM.  Neck: Soft, supple. No LAD. No JVD. CV: RRR.  Normal S1, S2. No m/r/g.  RP 2+ bilaterally. No BLEE. Resp: CTAB.  No w/r/r.  No increased WOB Abd: +BS. NTND on palpation. Psych: Cooperative. Pleasant. Makes eye contact. Speech normal. Extremities: Moves extremities spontaneously. Extremities warm and well perfused.  Neuro: CN II-XII grossly intact. No FNDs.   ASSESSMENT/PLAN:   HYPERTENSION, BENIGN SYSTEMIC 142/92 today.  Patient previously on 4 different blood pressure medications, however, per mom has been improving, though she has not been checking as much.  Patient is not taking any of his blood pressure medications right now.  Will start lisinopril 5 mg.  Follow-up lab visit in 3 days.  Recommended continuing to monitor blood pressures at home.  Write down on a piece of paper and bring to next appointment.  Follow-up in 1 month.   Annual Examination  See AVS for age appropriate recommendations  PHQ score -- did not fill out Blood pressure reviewed and at goal. No. See above.    Advanced directive - not  discussed    Considered the following items based upon USPSTF recommendations: HIV testing: not indicated Hepatitis C: ordered Hepatitis B: ordered Syphilis if at high risk: {not indicated GC/CTnot indicated Lipid panel (nonfasting or fasting) discussed based upon AHA recommendations and not ordered.  Consider repeat every 4-6 years.  Reviewed risk factors for latent tuberculosis and not indicated Immunizations - declines covid today     Melene Plan, MD [facility]

## 2020-04-16 ENCOUNTER — Encounter: Payer: Self-pay | Admitting: Family Medicine

## 2020-04-16 NOTE — Assessment & Plan Note (Signed)
142/92 today.  Patient previously on 4 different blood pressure medications, however, per mom has been improving, though she has not been checking as much.  Patient is not taking any of his blood pressure medications right now.  Will start lisinopril 5 mg.  Follow-up lab visit in 3 days.  Recommended continuing to monitor blood pressures at home.  Write down on a piece of paper and bring to next appointment.  Follow-up in 1 month.

## 2020-04-19 ENCOUNTER — Other Ambulatory Visit: Payer: Medicare Other

## 2020-04-22 ENCOUNTER — Other Ambulatory Visit: Payer: Medicare Other

## 2020-04-22 ENCOUNTER — Other Ambulatory Visit: Payer: Self-pay

## 2020-04-22 DIAGNOSIS — Z0000 Encounter for general adult medical examination without abnormal findings: Secondary | ICD-10-CM

## 2020-04-22 DIAGNOSIS — I10 Essential (primary) hypertension: Secondary | ICD-10-CM

## 2020-04-23 ENCOUNTER — Encounter: Payer: Self-pay | Admitting: Family Medicine

## 2020-04-23 LAB — BASIC METABOLIC PANEL
BUN/Creatinine Ratio: 8 — ABNORMAL LOW (ref 9–20)
BUN: 7 mg/dL (ref 6–20)
CO2: 25 mmol/L (ref 20–29)
Calcium: 9.2 mg/dL (ref 8.7–10.2)
Chloride: 103 mmol/L (ref 96–106)
Creatinine, Ser: 0.93 mg/dL (ref 0.76–1.27)
GFR calc Af Amer: 121 mL/min/{1.73_m2} (ref >59)
GFR calc non Af Amer: 105 mL/min/{1.73_m2} (ref >59)
Glucose: 85 mg/dL (ref 65–99)
Potassium: 4 mmol/L (ref 3.5–5.2)
Sodium: 140 mmol/L (ref 134–144)

## 2020-04-23 LAB — HEPATITIS C ANTIBODY: Hep C Virus Ab: 0.2 s/co ratio (ref 0.0–0.9)

## 2020-04-23 LAB — HEPATITIS B SURFACE ANTIBODY,QUALITATIVE: Hep B Surface Ab, Qual: NONREACTIVE

## 2020-04-29 ENCOUNTER — Telehealth: Payer: Self-pay

## 2020-04-29 NOTE — Telephone Encounter (Signed)
Mother call nurse line requesting to speak with PCP about recent lab results. I advised Mother his results were normal, however she would like reassurance from PCP for Hep C and Hep B. Please call her at your convenience.

## 2020-04-30 NOTE — Telephone Encounter (Signed)
Called patient's mother. She would like to talk with Dr. Selena Batten about results.

## 2020-05-02 NOTE — Telephone Encounter (Signed)
Called patient's home and her husband answered and stated that patient's mother was sleeping. Explained test results normal and I have no further recommendations. Patient has follow up appt with me in two weeks and we can discuss at that time as well. She can call the office with any further questions.

## 2020-05-21 ENCOUNTER — Telehealth: Payer: Self-pay

## 2020-05-21 ENCOUNTER — Encounter: Payer: Self-pay | Admitting: Family Medicine

## 2020-05-21 ENCOUNTER — Ambulatory Visit (INDEPENDENT_AMBULATORY_CARE_PROVIDER_SITE_OTHER): Payer: Medicare Other | Admitting: Family Medicine

## 2020-05-21 DIAGNOSIS — Z5329 Procedure and treatment not carried out because of patient's decision for other reasons: Secondary | ICD-10-CM

## 2020-05-21 NOTE — Telephone Encounter (Signed)
Called patient and his mother stated that she was not aware of an appointment today.  Glen Bauer, CMA

## 2020-05-21 NOTE — Progress Notes (Signed)
Patient did not show for appointment. This is the patient's second no show in 1 month. Patient also had a documented no show on 04/19/20. Patient was called and told about no show policy.  A certified letter explaining no show policy has also been sent to the patient via mail.   Melene Plan, M.D.  3:57 PM 05/21/2020

## 2020-12-30 ENCOUNTER — Ambulatory Visit: Payer: Medicare Other | Admitting: Family Medicine

## 2021-04-20 ENCOUNTER — Other Ambulatory Visit: Payer: Self-pay | Admitting: Family Medicine

## 2022-05-11 ENCOUNTER — Other Ambulatory Visit: Payer: Self-pay

## 2022-05-12 MED ORDER — LISINOPRIL 5 MG PO TABS: ORAL_TABLET | ORAL | 3 refills | Status: DC

## 2023-07-22 ENCOUNTER — Other Ambulatory Visit: Payer: Self-pay | Admitting: Student
